# Patient Record
Sex: Male | Born: 1975 | Race: White | Hispanic: No | Marital: Single | Smoking: Former smoker
Health system: Southern US, Community
[De-identification: ages and names within clinical notes are randomized; demographics above are authoritative.]

## PROBLEM LIST (undated history)

## (undated) DIAGNOSIS — C3492 Malignant neoplasm of unspecified part of left bronchus or lung: Secondary | ICD-10-CM

## (undated) HISTORY — DX: Malignant neoplasm of unspecified part of left bronchus or lung: C34.92

## (undated) HISTORY — PX: APPENDECTOMY: SHX54

---

## 2005-03-19 ENCOUNTER — Ambulatory Visit: Payer: Self-pay | Admitting: Cardiology

## 2005-03-19 ENCOUNTER — Emergency Department (HOSPITAL_COMMUNITY): Admission: EM | Admit: 2005-03-19 | Discharge: 2005-03-19 | Payer: Self-pay | Admitting: Emergency Medicine

## 2012-09-15 DIAGNOSIS — R627 Adult failure to thrive: Secondary | ICD-10-CM | POA: Insufficient documentation

## 2012-09-15 DIAGNOSIS — F1090 Alcohol use, unspecified, uncomplicated: Secondary | ICD-10-CM | POA: Insufficient documentation

## 2020-08-13 ENCOUNTER — Telehealth: Payer: Self-pay | Admitting: Pulmonary Disease

## 2020-08-13 NOTE — Telephone Encounter (Signed)
Call patient.  Apparently patient came in with few months of some chest pain, and viral type illness.  Found to have [place].  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  Is described that there may be some necrosis.  As patient is asymptomatic do think that it is reasonable to pursue outpatient work-up.  If patient develops symptoms of pneumonia or worsening acute consider inpatient treatment with antibiotics.  However patient is COVID-positive and ED physician was going to prescribe Paxil.  Think this is reasonable as well.  Will not be able to undergo any bronchoscopic interventions as patient is COVID-positive.  Given this is well think it is reasonable to pursue this on an outpatient basis.  ED physician asked if I could coordinate follow-up.  As I am a minor I do not have ties with actual PCCM.  Was discussed with him.  He is aware of that.  We will also contact health with coordination of follow-up.  They can.  ED physician asked if following up in 1 week be reasonable I agreed with this.  We will also attempt to arrange follow-up from my end as well.  Newell Coral DO Internal Medicine/Pediatrics Pulmonary and Critical Care Fellow PGY-7

## 2020-08-14 ENCOUNTER — Telehealth: Payer: Self-pay | Admitting: Pulmonary Disease

## 2020-08-14 NOTE — Telephone Encounter (Signed)
Please schedule the patient for a consult with Dr. Valeta Harms or Dr. Lamonte Sakai for pulmonary mass / nodule as soon as possible.    He was apparently seen as an outpatient at an outside hospital with a CT of the chest with contrast that showed the following:   Lungs/Pleura: Right lung is well aerated without focal infiltrate or sizable effusion. No parenchymal nodule is seen. Left lung is also well aerated without focal infiltrate or effusion. There is a somewhat spiculated mildly cavitated lesion with associated adjacent nodule seen in the anterior aspect of the left upper lobe. This measures approximately 2.6 x 2.2 cm in greatest AP and transverse dimensions respectively. This extends for approximately 2.6 cm in craniocaudad projection as well. The smaller adjacent nodules measure up to 6 mm.    Please call if questions.    Noe Gens, MSN, APRN, NP-C, AGACNP-BC [facility] 08/14/2020, 7:30 AM   Please see [email or web address] for pager details.   From 7A-7P if no response, please call [phone] After hours, please call ELink [phone]

## 2020-08-14 NOTE — Telephone Encounter (Signed)
Spoke with pt and scheduled for consult on 7/1 at 3:30. Pt was also diagnosed with Covid yesterday so per office policy pt must wait 10 asymptomatic days before coming into office. Pt is aware. Pt's CT was done at [facility] RN will reach out to [place] to secure CT scan prior to pt's consult. Nothing further needed at this time.  Routing to Dr. Lamonte Sakai as Juluis Rainier

## 2020-08-14 NOTE — Telephone Encounter (Signed)
Please put him in one of my reserved nodule spots   Will need to find or have him bring the images for me to review.

## 2020-08-25 ENCOUNTER — Ambulatory Visit (INDEPENDENT_AMBULATORY_CARE_PROVIDER_SITE_OTHER): Payer: Commercial Managed Care - PPO | Admitting: Emergency Medicine

## 2020-08-25 ENCOUNTER — Other Ambulatory Visit: Payer: Self-pay

## 2020-08-25 ENCOUNTER — Encounter: Payer: Self-pay | Admitting: Emergency Medicine

## 2020-08-25 VITALS — BP 120/78 | HR 71 | Temp 98.3°F | Ht 70.0 in | Wt 127.8 lb

## 2020-08-25 DIAGNOSIS — R911 Solitary pulmonary nodule: Secondary | ICD-10-CM | POA: Diagnosis not present

## 2020-08-25 MED ORDER — AMOXICILLIN-POT CLAVULANATE 875-125 MG PO TABS: 1.0000 | ORAL_TABLET | Freq: Two times a day (BID) | ORAL | 0 refills | Status: DC

## 2020-08-25 NOTE — Progress Notes (Signed)
Subjective:    Patient ID: Alex Sims, male    DOB: January 20, 1976, 45 y.o.   MRN: [id number]  HPI 45 year old man with a minimal tobacco history (1 pack year), GERD, who was seen in the ED at [place] with URI symptoms and upper abdominal discomfort, chest pain.  His COVID-19 on 6/19 was positive.   He underwent a CT scan of his chest 08/13/2020 to evaluate the chest discomfort.  I have the report available.  There were no hilar or mediastinal lymph nodes.  There is a somewhat spiculated mild cavitated 2.6 x 2.2 lesion in the left upper lobe.   He was treated with paxlovid, couldn't take it due to side effects. Doesn't sound like he was given any abx for the LUL findings. Still has mid chest pain. Intermittently uses omeprazole, no real effect. No fever, mucous production, no fevers.    Review of Systems As per HPI  Past medical history: GERD Appendectomy  Family history: Negative for CAD, negative for lung cancer  Social History   Socioeconomic History   Marital status: Single    Spouse name: Not on file   Number of children: Not on file   Years of education: Not on file   Highest education level: Not on file  Occupational History   Not on file  Tobacco Use   Smoking status: Some Days    Pack years: 0.00    Types: Cigarettes   Smokeless tobacco: Never   Tobacco comments:    Smokes when drinking, 1-2 on weekends or a day  Vaping Use   Vaping Use: Never used  Substance and Sexual Activity   Alcohol use: Yes    Comment: pint of liquor a day   Drug use: Not on file   Sexual activity: Not on file  Other Topics Concern   Not on file  Social History Narrative   Not on file   Social Determinants of Health   Financial Resource Strain: Not on file  Food Insecurity: Not on file  Transportation Needs: Not on file  Physical Activity: Not on file  Stress: Not on file  Social Connections: Not on file  Intimate Partner Violence: Not on file    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  No military [place] native No TB exposure Remotely worked in Theme park manager.   Not on File   Outpatient Medications Prior to Visit  Medication Sig Dispense Refill   albuterol (VENTOLIN HFA) 108 (90 Base) MCG/ACT inhaler Inhale 2 puffs into the lungs as needed.     omeprazole (PRILOSEC) 20 MG capsule Take 20 mg by mouth daily.     terbinafine (LAMISIL) 250 MG tablet Take 250 mg by mouth daily.     Nirmatrelvir & Ritonavir (PAXLOVID) 20 x 150 MG & 10 x 100MG  TBPK See package instructions.  Date of symptom onset June 16.     PAXLOVID 20 x 150 MG & 10 x 100MG  TBPK Take by mouth.     No facility-administered medications prior to visit.        Objective:   Physical Exam Vitals:   08/25/20 1524  BP: 120/78  Pulse: 71  Temp: 98.3 F (36.8 C)  TempSrc: Oral  SpO2: 96%  Weight: 127 lb 12.8 oz (58 kg)  Height: 5\' 10"  (1.778 m)    Gen: Pleasant, thin, in no distress,  normal affect  ENT: No lesions,  mouth clear,  oropharynx clear, no postnasal  drip  Neck: No JVD, no stridor  Lungs: No use of accessory muscles, no crackles or wheezing on normal respiration, no wheeze on forced expiration  Cardiovascular: RRR, heart sounds normal, no murmur or gallops, no peripheral edema  Musculoskeletal: No deformities, no cyanosis or clubbing  Neuro: alert, awake, non focal  Skin: Warm, no lesions or rash      Assessment & Plan:  Pulmonary nodule 1 cm or greater in diameter Left upper lobe pulmonary nodule in a patient with a minimal tobacco history.  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  He denies any infectious symptoms.  The nodule is concerning for possible malignancy.  He has not been treated empirically.  We will give him Augmentin and then repeat the CT to see if there is an interval change.  Discussed with him that we will need to arrange for bronchoscopy to better evaluate if the  nodule persists.    Please take Augmentin 875 mg twice a day for 7 days.  Call us if you have problems with this medication so that we can order a substitute We will plan to repeat your CT scan of the chest in mid July after you have finished the antibiotics. Follow Dr. Lamonte Sakai next available after the CT scan so that we can review the results together.  Depending on the results we will decide whether we need to arrange for a bronchoscopy to better evaluate your pulmonary nodule.   [name], MD, PhD 08/25/2020, 3:43 PM [facility] [phone] or if no answer before 7:00PM call [phone] For any issues after 7:00PM please call eLink [phone]

## 2020-08-25 NOTE — Assessment & Plan Note (Signed)
Left upper lobe pulmonary nodule in a patient with a minimal tobacco history.  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  He denies any infectious symptoms.  The nodule is concerning for possible malignancy.  He has not been treated empirically.  We will give him Augmentin and then repeat the CT to see if there is an interval change.  Discussed with him that we will need to arrange for bronchoscopy to better evaluate if the nodule persists.    Please take Augmentin 875 mg twice a day for 7 days.  Call us if you have problems with this medication so that we can order a substitute We will plan to repeat your CT scan of the chest in mid July after you have finished the antibiotics. Follow Dr. Lamonte Sakai next available after the CT scan so that we can review the results together.  Depending on the results we will decide whether we need to arrange for a bronchoscopy to better evaluate your pulmonary nodule.

## 2020-08-25 NOTE — Patient Instructions (Signed)
Please take Augmentin 875 mg twice a day for 7 days.  Call us if you have problems with this medication so that we can order a substitute We will plan to repeat your CT scan of the chest in mid July after you have finished the antibiotics. Follow Dr. Lamonte Sakai next available after the CT scan so that we can review the results together.  Depending on the results we will decide whether we need to arrange for a bronchoscopy to better evaluate your pulmonary nodule.

## 2020-09-05 ENCOUNTER — Other Ambulatory Visit: Payer: Self-pay

## 2020-09-05 ENCOUNTER — Ambulatory Visit (INDEPENDENT_AMBULATORY_CARE_PROVIDER_SITE_OTHER)
Admission: RE | Admit: 2020-09-05 | Discharge: 2020-09-05 | Disposition: A | Discharge status: Home / Self Care | Payer: Commercial Managed Care - PPO | Source: Ambulatory Visit | Attending: Emergency Medicine | Admitting: Emergency Medicine

## 2020-09-05 DIAGNOSIS — R911 Solitary pulmonary nodule: Secondary | ICD-10-CM | POA: Diagnosis not present

## 2020-09-08 ENCOUNTER — Telehealth: Payer: Self-pay | Admitting: Emergency Medicine

## 2020-09-08 NOTE — Telephone Encounter (Signed)
Spoke to patient, who is requesting CT results. He would like to know results today if possible.  Dr. Lamonte Sakai, please advise. Thanks

## 2020-09-11 NOTE — Telephone Encounter (Signed)
Called the patient to discuss CT results. LMOVM.  I am planning to arrange for robotic FOB and biopsies. Will also ordeer a PET scan. Will try to call him back to discuss.

## 2020-09-12 NOTE — Telephone Encounter (Signed)
Called the patient again, no answer, left message

## 2020-09-15 ENCOUNTER — Ambulatory Visit (INDEPENDENT_AMBULATORY_CARE_PROVIDER_SITE_OTHER): Payer: Commercial Managed Care - PPO | Admitting: Emergency Medicine

## 2020-09-15 ENCOUNTER — Other Ambulatory Visit: Payer: Self-pay

## 2020-09-15 ENCOUNTER — Telehealth: Payer: Self-pay | Admitting: Emergency Medicine

## 2020-09-15 ENCOUNTER — Encounter: Payer: Self-pay | Admitting: Emergency Medicine

## 2020-09-15 VITALS — BP 112/76 | HR 62 | Temp 97.8°F | Ht 70.0 in | Wt 132.0 lb

## 2020-09-15 DIAGNOSIS — R911 Solitary pulmonary nodule: Secondary | ICD-10-CM

## 2020-09-15 NOTE — Progress Notes (Signed)
Subjective:    Patient ID: Alex Sims, male    DOB: [birth year], 45 y.o.   MRN: [id number]  HPI 45 year old man with a minimal tobacco history (1 pack year), GERD, who was seen in the ED at [place] with URI symptoms and upper abdominal discomfort, chest pain.  His COVID-19 on 6/19 was positive.   He underwent a CT scan of his chest 08/13/2020 to evaluate the chest discomfort.  I have the report available.  There were no hilar or mediastinal lymph nodes.  There is a somewhat spiculated mild cavitated 2.6 x 2.2 lesion in the left upper lobe.   He was treated with paxlovid, couldn't take it due to side effects. Doesn't sound like he was given any abx for the LUL findings. Still has mid chest pain. Intermittently uses omeprazole, no real effect. No fever, mucous production, no fevers.    ROV 09/15/20 --H[place] follows up today for his abnormal CT scan of the chest.  He is 45 with a minimal tobacco history who underwent a CT scan of his chest when he was diagnosed with COVID-19 08/13/2020.  There was a somewhat spiculated mild cavitated left upper lobe lesion.  I treated him with antibiotics in case this was a pneumonia with plans to repeat his CT.  This was done on 09/05/2020 as below.  CT chest 09/05/2020 reviewed by me shows that the peripheral anterior lateral left upper lobe nodule is unchanged, measures 3.1 cm with multiple surrounding satellite nodules up to 6 mm.  There is no mediastinal adenopathy   Review of Systems As per HPI  Past medical history: GERD Appendectomy  Family history: Negative for CAD, negative for lung cancer  Social History   Socioeconomic History   Marital status: Single    Spouse name: Not on file   Number of children: Not on file   Years of education: Not on file   Highest education level: Not on file  Occupational History   Not on file  Tobacco Use   Smoking status: Some Days    Types: Cigarettes   Smokeless tobacco: Never   Tobacco  comments:    Smokes when drinking, 1-2 on weekends or a day  Vaping Use   Vaping Use: Never used  Substance and Sexual Activity   Alcohol use: Yes    Comment: pint of liquor a day   Drug use: Not on file   Sexual activity: Not on file  Other Topics Concern   Not on file  Social History Narrative   Not on file   Social Determinants of Health   Financial Resource Strain: Not on file  Food Insecurity: Not on file  Transportation Needs: Not on file  Physical Activity: Not on file  Stress: Not on file  Social Connections: Not on file  Intimate Partner Violence: Not on file    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  No military [place] native No TB exposure Remotely worked in Theme park manager.   No Known Allergies   Outpatient Medications Prior to Visit  Medication Sig Dispense Refill   albuterol (VENTOLIN HFA) 108 (90 Base) MCG/ACT inhaler Inhale 2 puffs into the lungs as needed.     omeprazole (PRILOSEC) 20 MG capsule Take 20 mg by mouth daily.     terbinafine (LAMISIL) 250 MG tablet Take 250 mg by mouth daily.     amoxicillin-clavulanate (AUGMENTIN) 875-125 MG tablet Take 1 tablet by mouth 2 (two) times daily. (  Patient not taking: Reported on 09/15/2020) 14 tablet 0   No facility-administered medications prior to visit.        Objective:   Physical Exam Vitals:   09/15/20 1502  BP: 112/76  Pulse: 62  Temp: 97.8 F (36.6 C)  TempSrc: Oral  SpO2: 98%  Weight: 132 lb (59.9 kg)  Height: 5\' 10"  (1.778 m)    Gen: Pleasant, thin, in no distress,  normal affect  ENT: No lesions,  mouth clear,  oropharynx clear, no postnasal drip  Neck: No JVD, no stridor  Lungs: No use of accessory muscles, no crackles or wheezing on normal respiration, no wheeze on forced expiration  Cardiovascular: RRR, heart sounds normal, no murmur or gallops, no peripheral edema  Musculoskeletal: No deformities, no cyanosis or clubbing  Neuro: alert, awake,  non focal  Skin: Warm, no lesions or rash      Assessment & Plan:  Pulmonary nodule 1 cm or greater in diameter His left upper lobe mass lesion has not changed after treatment with Augmentin.  Identical to his June CT scan including satellite lesion of 6 mm.  I believe he needs a tissue diagnosis.  Discussed the pros and cons of bronchoscopy with him today and he agrees to proceed.  I will arrange a robot-assisted bronchoscopy hopefully for 10/02/2020.  I will also arrange for pulmonary function testing to assess his suitability for possible resection should this be indicated going forward.  Time spent 30 minutes  [name], MD, PhD 09/15/2020, 3:39 PM [facility] [phone] or if no answer before 7:00PM call [phone] For any issues after 7:00PM please call eLink [phone]

## 2020-09-15 NOTE — Assessment & Plan Note (Signed)
His left upper lobe mass lesion has not changed after treatment with Augmentin.  Identical to his June CT scan including satellite lesion of 6 mm.  I believe he needs a tissue diagnosis.  Discussed the pros and cons of bronchoscopy with him today and he agrees to proceed.  I will arrange a robot-assisted bronchoscopy hopefully for 10/02/2020.  I will also arrange for pulmonary function testing to assess his suitability for possible resection should this be indicated going forward.

## 2020-09-15 NOTE — Telephone Encounter (Signed)
Will review with him at [place] today.

## 2020-09-15 NOTE — Telephone Encounter (Signed)
Pt informed of the following:  Covid test 8/5 between 8am- 12pm  Bronch 8/8 at 10:45 am; 8:45am arrival time  Case # [id number]  Super D disk sent to Endo 7/12 per order

## 2020-09-15 NOTE — Patient Instructions (Signed)
We will arrange for robot assisted bronchoscopy to evaluate your left upper lobe pulmonary nodule.  We will try to get this scheduled for 10/02/2020 as an outpatient.  This will be done under general anesthesia so you will need a designated driver. We will arrange for pulmonary function testing. Follow with Dr Lamonte Sakai in 1 month

## 2020-09-15 NOTE — H&P (View-Only) (Signed)
Subjective:    Patient ID: Alex Sims, male    DOB: May 28, 1975, 45 y.o.   MRN: [id number]  HPI 45 year old man with a minimal tobacco history (1 pack year), GERD, who was seen in the ED at [place] with URI symptoms and upper abdominal discomfort, chest pain.  His COVID-19 on 6/19 was positive.   He underwent a CT scan of his chest 08/13/2020 to evaluate the chest discomfort.  I have the report available.  There were no hilar or mediastinal lymph nodes.  There is a somewhat spiculated mild cavitated 2.6 x 2.2 lesion in the left upper lobe.   He was treated with paxlovid, couldn't take it due to side effects. Doesn't sound like he was given any abx for the LUL findings. Still has mid chest pain. Intermittently uses omeprazole, no real effect. No fever, mucous production, no fevers.    ROV 09/15/20 --Jaion follows up today for his abnormal CT scan of the chest.  He is 45 with a minimal tobacco history who underwent a CT scan of his chest when he was diagnosed with COVID-19 08/13/2020.  There was a somewhat spiculated mild cavitated left upper lobe lesion.  I treated him with antibiotics in case this was a pneumonia with plans to repeat his CT.  This was done on 09/05/2020 as below.  CT chest 09/05/2020 reviewed by me shows that the peripheral anterior lateral left upper lobe nodule is unchanged, measures 3.1 cm with multiple surrounding satellite nodules up to 6 mm.  There is no mediastinal adenopathy   Review of Systems As per HPI  Past medical history: GERD Appendectomy  Family history: Negative for CAD, negative for lung cancer  Social History   Socioeconomic History   Marital status: Single    Spouse name: Not on file   Number of children: Not on file   Years of education: Not on file   Highest education level: Not on file  Occupational History   Not on file  Tobacco Use   Smoking status: Some Days    Types: Cigarettes   Smokeless tobacco: Never   Tobacco  comments:    Smokes when drinking, 1-2 on weekends or a day  Vaping Use   Vaping Use: Never used  Substance and Sexual Activity   Alcohol use: Yes    Comment: pint of liquor a day   Drug use: Not on file   Sexual activity: Not on file  Other Topics Concern   Not on file  Social History Narrative   Not on file   Social Determinants of Health   Financial Resource Strain: Not on file  Food Insecurity: Not on file  Transportation Needs: Not on file  Physical Activity: Not on file  Stress: Not on file  Social Connections: Not on file  Intimate Partner Violence: Not on file    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  No military [place] native No TB exposure Remotely worked in Theme park manager.   No Known Allergies   Outpatient Medications Prior to Visit  Medication Sig Dispense Refill   albuterol (VENTOLIN HFA) 108 (90 Base) MCG/ACT inhaler Inhale 2 puffs into the lungs as needed.     omeprazole (PRILOSEC) 20 MG capsule Take 20 mg by mouth daily.     terbinafine (LAMISIL) 250 MG tablet Take 250 mg by mouth daily.     amoxicillin-clavulanate (AUGMENTIN) 875-125 MG tablet Take 1 tablet by mouth 2 (two) times daily. (  Patient not taking: Reported on 09/15/2020) 14 tablet 0   No facility-administered medications prior to visit.        Objective:   Physical Exam Vitals:   09/15/20 1502  BP: 112/76  Pulse: 62  Temp: 97.8 F (36.6 C)  TempSrc: Oral  SpO2: 98%  Weight: 132 lb (59.9 kg)  Height: 5\' 10"  (1.778 m)    Gen: Pleasant, thin, in no distress,  normal affect  ENT: No lesions,  mouth clear,  oropharynx clear, no postnasal drip  Neck: No JVD, no stridor  Lungs: No use of accessory muscles, no crackles or wheezing on normal respiration, no wheeze on forced expiration  Cardiovascular: RRR, heart sounds normal, no murmur or gallops, no peripheral edema  Musculoskeletal: No deformities, no cyanosis or clubbing  Neuro: alert, awake,  non focal  Skin: Warm, no lesions or rash      Assessment & Plan:  Pulmonary nodule 1 cm or greater in diameter His left upper lobe mass lesion has not changed after treatment with Augmentin.  Identical to his June CT scan including satellite lesion of 6 mm.  I believe he needs a tissue diagnosis.  Discussed the pros and cons of bronchoscopy with him today and he agrees to proceed.  I will arrange a robot-assisted bronchoscopy hopefully for 10/02/2020.  I will also arrange for pulmonary function testing to assess his suitability for possible resection should this be indicated going forward.  Time spent 30 minutes  [name], MD, PhD 09/15/2020, 3:39 PM [facility] [phone] or if no answer before 7:00PM call [phone] For any issues after 7:00PM please call eLink [phone]

## 2020-09-19 NOTE — Telephone Encounter (Signed)
Patient would like to know how long Bronch procedure is. Patient phone number is [phone].

## 2020-09-19 NOTE — Telephone Encounter (Signed)
I called and spoke with patient regarding questions on Bronch procedure. I answered each question and patient verbalized understanding, nothing further needed.

## 2020-09-29 ENCOUNTER — Encounter (HOSPITAL_COMMUNITY): Payer: Self-pay | Admitting: Emergency Medicine

## 2020-09-29 ENCOUNTER — Other Ambulatory Visit: Payer: Self-pay

## 2020-09-29 NOTE — Progress Notes (Signed)
PCP - denies  Cardiologist - denies EKG -  Chest x-ray -  ECHO -  Cardiac Cath -  CPAP -    COVID TEST- postitive 6/19 -   Anesthesia review: n/a  -------------  SDW INSTRUCTIONS:  Your procedure is scheduled on Monday 8/8. Please report to [facility] Main Entrance "A" at 0830 A.M., and check in at the Admitting office. Call this number if you have problems the morning of surgery: [phone]   Remember: Do not eat or drink after midnight the night before your surgery  Medications to take morning of surgery with a sip of water include: Inhaler  --- Please bring all inhalers with you the day of surgery.    As of today, STOP taking any Aspirin (unless otherwise instructed by your surgeon), Aleve, Naproxen, Ibuprofen, Motrin, Advil, Goody's, BC's, all herbal medications, fish oil, and all vitamins.    The Morning of Surgery Do not wear jewelry Do not wear lotions, powders, colognes, or deodorant Men may shave face and neck. Do not bring valuables to the hospital. [facility] is not responsible for any belongings or valuables.  If you are a smoker, DO NOT Smoke 24 hours prior to surgery  If you wear a CPAP at night please bring your mask the morning of surgery   Remember that you must have someone to transport you home after your surgery, and remain with you for 24 hours if you are discharged the same day.  Please bring cases for contacts, glasses, hearing aids, dentures or bridgework because it cannot be worn into surgery.   Patients discharged the day of surgery will not be allowed to drive home.   Please shower the NIGHT BEFORE/MORNING OF SURGERY (use antibacterial soap like DIAL soap if possible). Wear comfortable clothes the morning of surgery. Oral Hygiene is also important to reduce your risk of infection.  Remember - BRUSH YOUR TEETH THE MORNING OF SURGERY WITH YOUR REGULAR TOOTHPASTE  Patient denies shortness of breath, fever, cough and chest pain.

## 2020-10-02 ENCOUNTER — Ambulatory Visit (HOSPITAL_COMMUNITY)
Admission: RE | Admit: 2020-10-02 | Discharge: 2020-10-02 | Disposition: A | Discharge status: Home / Self Care | Payer: Commercial Managed Care - PPO | Attending: Emergency Medicine | Admitting: Emergency Medicine

## 2020-10-02 ENCOUNTER — Encounter (HOSPITAL_COMMUNITY)
Admission: RE | Disposition: A | Discharge status: Home / Self Care | Payer: Self-pay | Source: Home / Self Care | Attending: Emergency Medicine

## 2020-10-02 ENCOUNTER — Telehealth: Payer: Self-pay | Admitting: Emergency Medicine

## 2020-10-02 ENCOUNTER — Ambulatory Visit (HOSPITAL_COMMUNITY): Payer: Commercial Managed Care - PPO | Admitting: Anesthesiology

## 2020-10-02 ENCOUNTER — Other Ambulatory Visit: Payer: Self-pay

## 2020-10-02 ENCOUNTER — Ambulatory Visit (HOSPITAL_COMMUNITY): Payer: Commercial Managed Care - PPO

## 2020-10-02 ENCOUNTER — Encounter (HOSPITAL_COMMUNITY): Payer: Self-pay | Admitting: Emergency Medicine

## 2020-10-02 DIAGNOSIS — Z7951 Long term (current) use of inhaled steroids: Secondary | ICD-10-CM | POA: Diagnosis not present

## 2020-10-02 DIAGNOSIS — Z419 Encounter for procedure for purposes other than remedying health state, unspecified: Secondary | ICD-10-CM

## 2020-10-02 DIAGNOSIS — Z9889 Other specified postprocedural states: Secondary | ICD-10-CM

## 2020-10-02 DIAGNOSIS — Z8616 Personal history of COVID-19: Secondary | ICD-10-CM | POA: Insufficient documentation

## 2020-10-02 DIAGNOSIS — K219 Gastro-esophageal reflux disease without esophagitis: Secondary | ICD-10-CM | POA: Diagnosis not present

## 2020-10-02 DIAGNOSIS — C3412 Malignant neoplasm of upper lobe, left bronchus or lung: Secondary | ICD-10-CM | POA: Insufficient documentation

## 2020-10-02 DIAGNOSIS — Z79899 Other long term (current) drug therapy: Secondary | ICD-10-CM | POA: Insufficient documentation

## 2020-10-02 DIAGNOSIS — R911 Solitary pulmonary nodule: Secondary | ICD-10-CM | POA: Diagnosis present

## 2020-10-02 DIAGNOSIS — F17210 Nicotine dependence, cigarettes, uncomplicated: Secondary | ICD-10-CM | POA: Insufficient documentation

## 2020-10-02 HISTORY — PX: BRONCHIAL BRUSHINGS: SHX5108

## 2020-10-02 HISTORY — PX: BRONCHIAL NEEDLE ASPIRATION BIOPSY: SHX5106

## 2020-10-02 HISTORY — PX: BRONCHIAL WASHINGS: SHX5105

## 2020-10-02 HISTORY — PX: BRONCHIAL BIOPSY: SHX5109

## 2020-10-02 HISTORY — PX: VIDEO BRONCHOSCOPY WITH RADIAL ENDOBRONCHIAL ULTRASOUND: SHX6849

## 2020-10-02 HISTORY — PX: VIDEO BRONCHOSCOPY WITH ENDOBRONCHIAL NAVIGATION: SHX6175

## 2020-10-02 LAB — COMPREHENSIVE METABOLIC PANEL
ALT: 10 U/L (ref 0–44)
AST: 20 U/L (ref 15–41)
Albumin: <1 g/dL — ABNORMAL LOW (ref 3.5–5.0)
Alkaline Phosphatase: 55 U/L (ref 38–126)
Anion gap: 8 (ref 5–15)
BUN: 12 mg/dL (ref 6–20)
CO2: 25 mmol/L (ref 22–32)
Calcium: 8.9 mg/dL (ref 8.9–10.3)
Chloride: 104 mmol/L (ref 98–111)
Creatinine, Ser: <0.3 mg/dL — ABNORMAL LOW (ref 0.61–1.24)
Glucose, Bld: 90 mg/dL (ref 70–99)
Potassium: 3.5 mmol/L (ref 3.5–5.1)
Sodium: 137 mmol/L (ref 135–145)
Total Bilirubin: 0.6 mg/dL (ref 0.3–1.2)
Total Protein: <3 g/dL — ABNORMAL LOW (ref 6.5–8.1)

## 2020-10-02 LAB — CBC
HCT: 45.9 % (ref 39.0–52.0)
Hemoglobin: 15.8 g/dL (ref 13.0–17.0)
MCH: 32.4 pg (ref 26.0–34.0)
MCHC: 34.4 g/dL (ref 30.0–36.0)
MCV: 94.3 fL (ref 80.0–100.0)
Platelets: 268 10*3/uL (ref 150–400)
RBC: 4.87 MIL/uL (ref 4.22–5.81)
RDW: 12.7 % (ref 11.5–15.5)
WBC: 5.8 10*3/uL (ref 4.0–10.5)
nRBC: 0 % (ref 0.0–0.2)

## 2020-10-02 SURGERY — VIDEO BRONCHOSCOPY WITH ENDOBRONCHIAL NAVIGATION
Anesthesia: General

## 2020-10-02 MED ORDER — MIDAZOLAM HCL 5 MG/5ML IJ SOLN
INTRAMUSCULAR | Status: DC | PRN
  Administered 2020-10-02: 2 mg via INTRAVENOUS

## 2020-10-02 MED ORDER — SUCCINYLCHOLINE CHLORIDE 200 MG/10ML IV SOSY
PREFILLED_SYRINGE | INTRAVENOUS | Status: DC | PRN
  Administered 2020-10-02: 100 mg via INTRAVENOUS

## 2020-10-02 MED ORDER — FENTANYL CITRATE (PF) 100 MCG/2ML IJ SOLN: 25.0000 ug | INTRAMUSCULAR | Status: DC | PRN

## 2020-10-02 MED ORDER — CHLORHEXIDINE GLUCONATE 0.12 % MT SOLN
15.0000 mL | Freq: Once | OROMUCOSAL | Status: AC
  Filled 2020-10-02: qty 15

## 2020-10-02 MED ORDER — CHLORHEXIDINE GLUCONATE 0.12 % MT SOLN
OROMUCOSAL | Status: AC
  Administered 2020-10-02: 15 mL via OROMUCOSAL
  Filled 2020-10-02: qty 15

## 2020-10-02 MED ORDER — SUGAMMADEX SODIUM 200 MG/2ML IV SOLN
INTRAVENOUS | Status: DC | PRN
  Administered 2020-10-02: 300 mg via INTRAVENOUS

## 2020-10-02 MED ORDER — DEXAMETHASONE SODIUM PHOSPHATE 4 MG/ML IJ SOLN
INTRAMUSCULAR | Status: DC | PRN
  Administered 2020-10-02: 10 mg via INTRAVENOUS

## 2020-10-02 MED ORDER — FENTANYL CITRATE (PF) 100 MCG/2ML IJ SOLN
INTRAMUSCULAR | Status: DC | PRN
  Administered 2020-10-02 (×2): 50 ug via INTRAVENOUS

## 2020-10-02 MED ORDER — LIDOCAINE 2% (20 MG/ML) 5 ML SYRINGE
INTRAMUSCULAR | Status: DC | PRN
  Administered 2020-10-02: 60 mg via INTRAVENOUS

## 2020-10-02 MED ORDER — LACTATED RINGERS IV SOLN: INTRAVENOUS | Status: DC

## 2020-10-02 MED ORDER — PROPOFOL 10 MG/ML IV BOLUS
INTRAVENOUS | Status: DC | PRN
  Administered 2020-10-02: 150 mg via INTRAVENOUS
  Administered 2020-10-02: 50 mg via INTRAVENOUS

## 2020-10-02 MED ORDER — ONDANSETRON HCL 4 MG/2ML IJ SOLN
INTRAMUSCULAR | Status: DC | PRN
  Administered 2020-10-02: 4 mg via INTRAVENOUS

## 2020-10-02 MED ORDER — ROCURONIUM BROMIDE 10 MG/ML (PF) SYRINGE
PREFILLED_SYRINGE | INTRAVENOUS | Status: DC | PRN
  Administered 2020-10-02: 50 mg via INTRAVENOUS
  Administered 2020-10-02: 20 mg via INTRAVENOUS
  Administered 2020-10-02: 30 mg via INTRAVENOUS

## 2020-10-02 MED ORDER — ONDANSETRON HCL 4 MG/2ML IJ SOLN: 4.0000 mg | Freq: Once | INTRAMUSCULAR | Status: DC | PRN

## 2020-10-02 NOTE — Interval H&P Note (Signed)
History and Physical Interval Note:  10/02/2020 10:01 AM  Alex Sims  has presented today for surgery, with the diagnosis of LEFT UPPER NODE MASS.  The various methods of treatment have been discussed with the patient and family. After consideration of risks, benefits and other options for treatment, the patient has consented to  Procedure(s): ROBOTIC [place] (N/A) as a surgical intervention.  The patient's history has been reviewed, patient examined, no change in status, stable for surgery.  I have reviewed the patient's chart and labs.  Questions were answered to the patient's satisfaction.     Collene Gobble

## 2020-10-02 NOTE — Telephone Encounter (Signed)
He has been positive within the 90 day window. Asymptomatic. Planning to proceed today

## 2020-10-02 NOTE — Transfer of Care (Signed)
Immediate Anesthesia Transfer of Care Note  Patient: Alex Sims  Procedure(s) Performed: ROBOTIC BRONCHOSCOPY WITH ENDOBRONCHIAL NAVIGATION BRONCHIAL BRUSHINGS BRONCHIAL NEEDLE ASPIRATION BIOPSIES BRONCHIAL BIOPSIES  Patient Location: Endoscopy Unit  Anesthesia Type:General  Level of Consciousness: awake  Airway & Oxygen Therapy: Patient Spontanous Breathing and Patient connected to face mask oxygen  Post-op Assessment: Report given to RN and Post -op Vital signs reviewed and stable  Post vital signs: Reviewed and stable  Last Vitals:  Vitals Value Taken Time  BP 120/80 10/02/20 1305  Temp    Pulse 70 10/02/20 1307  Resp 13 10/02/20 1307  SpO2 100 % 10/02/20 1307  Vitals shown include unvalidated device data.  Last Pain:  Vitals:   10/02/20 0928  TempSrc:   PainSc: 0-No pain         Complications: No notable events documented.

## 2020-10-02 NOTE — Op Note (Signed)
Video Bronchoscopy with Robotic Assisted Bronchoscopic Navigation   Date of Operation: 09/18/2020  Pre-op Diagnosis: Left upper lobe nodule  Post-op Diagnosis: Same  Surgeon: [name]  Assistants: Franchot Heidelberg  Anesthesia: General endotracheal anesthesia  Operation: Flexible video fiberoptic bronchoscopy with robotic assistance and biopsies.  Estimated Blood Loss: Minimal  Complications: None  Indications and History: Alex Sims is a 45 y.o. male with history of tobacco use.  He was found to he have a left upper lobe pulmonary nodule when he was evaluated for cough.  Treated for pneumonia but the nodule did not resolve on CT scan 09/05/2020.  Recommendation was made to achieve tissue diagnosis via robotic assisted bronchoscopy with navigation and biopsies. The risks, benefits, complications, treatment options and expected outcomes were discussed with the patient.  The possibilities of pneumothorax, pneumonia, reaction to medication, pulmonary aspiration, perforation of a viscus, bleeding, failure to diagnose a condition and creating a complication requiring transfusion or operation were discussed with the patient who freely signed the consent.    Description of Procedure: The patient was seen in the Preoperative Area, was examined and was deemed appropriate to proceed.  The patient was taken to [facility] endoscopy room 3, identified as Alex Sims and the procedure verified as Flexible Video Fiberoptic Bronchoscopy.  A Time Out was held and the above information confirmed.   Prior to the date of the procedure a high-resolution CT scan of the chest was performed. Utilizing ION software program a virtual tracheobronchial tree was generated to allow the creation of distinct navigation pathways to the patient's parenchymal abnormalities. After being taken to the operating room general anesthesia was initiated and the patient  was orally intubated. The video fiberoptic bronchoscope was  introduced via the endotracheal tube and a general inspection was performed which showed normal right and left lung anatomy, aspiration of the bilateral mainstems was completed to remove any remaining secretions. Robotic catheter inserted into patient's endotracheal tube.   Target #1 left upper lobe nodule: The distinct navigation pathways prepared prior to this procedure were then utilized to navigate to patient's lesion identified on CT scan. The robotic catheter was secured into place and the vision probe was withdrawn.  Lesion location was approximated using fluoroscopy and radial endobronchial ultrasound for peripheral targeting. Under fluoroscopic guidance transbronchial needle brushings, transbronchial needle biopsies, and transbronchial forceps biopsies were performed to be sent for cytology and pathology. A bronchioalveolar lavage was performed in the left upper lobe and sent for cytology.   At the end of the procedure a general airway inspection was performed and there was no evidence of active bleeding. The bronchoscope was removed.  The patient tolerated the procedure well. There was no significant blood loss and there were no obvious complications. A post-procedural chest x-ray is pending.  Samples Target #1: 1. Transbronchial needle brushings from left upper lobe nodule 2. Transbronchial Wang needle biopsies from left upper lobe nodule 3. Transbronchial forceps biopsies from left upper lobe nodule 4. Bronchoalveolar lavage from left upper lobe    Plans:  The patient will be discharged from the PACU to home when recovered from anesthesia and after chest x-ray is reviewed. We will review the cytology, pathology and microbiology results with the patient when they become available. Outpatient followup will be with Dr Lamonte Sakai.    [name], MD, PhD 10/02/2020, 12:58 PM [facility] [phone] or if no answer before 7:00PM call [phone] For any issues after  7:00PM please call eLink [phone]

## 2020-10-02 NOTE — Telephone Encounter (Addendum)
LMTCB x2.   Will forward message to provider as Juluis Rainier so he is aware.   RB please be aware patient did not get his covid test and has called to reschedule. Will have more information once we speak with patient.    10/02/20 4:20pm. Patient had Bronch done.   Nothing further needed at this time.

## 2020-10-02 NOTE — Discharge Instructions (Signed)
Flexible Bronchoscopy, Care After This sheet gives you information about how to care for yourself after your test. Your doctor may also give you more specific instructions. If you have problems or questions, contact your doctor. Follow these instructions at home: Eating and drinking Do not eat or drink anything (not even water) for 2 hours after your test, or until your numbing medicine (local anesthetic) wears off. When your numbness is gone and your cough and gag reflexes have come back, you may: Eat only soft foods. Slowly drink liquids. The day after the test, go back to your normal diet. Driving Do not drive for 24 hours if you were given a medicine to help you relax (sedative). Do not drive or use heavy machinery while taking prescription pain medicine. General instructions  Take over-the-counter and prescription medicines only as told by your doctor. Return to your normal activities as told. Ask what activities are safe for you. Do not use any products that have nicotine or tobacco in them. This includes cigarettes and e-cigarettes. If you need help quitting, ask your doctor. Keep all follow-up visits as told by your doctor. This is important. It is very important if you had a tissue sample (biopsy) taken. Get help right away if: You have shortness of breath that gets worse. You get light-headed. You feel like you are going to pass out (faint). You have chest pain. You cough up: More than a little blood. More blood than before. Summary Do not eat or drink anything (not even water) for 2 hours after your test, or until your numbing medicine wears off. Do not use cigarettes. Do not use e-cigarettes. Get help right away if you have chest pain.  Please call our office for any questions or concerns.  [phone].  This information is not intended to replace advice given to you by your health care provider. Make sure you discuss any questions you have with your health care  provider. Document Released: 12/09/2008 Document Revised: 01/24/2017 Document Reviewed: 03/01/2016 Elsevier Patient Education  2020 [employer].

## 2020-10-02 NOTE — Anesthesia Procedure Notes (Signed)
Procedure Name: Intubation Date/Time: 10/02/2020 10:30 AM Performed by: Lieutenant Diego, CRNA Pre-anesthesia Checklist: Patient identified, Emergency Drugs available, Suction available and Patient being monitored Patient Re-evaluated:Patient Re-evaluated prior to induction Oxygen Delivery Method: Circle system utilized Preoxygenation: Pre-oxygenation with 100% oxygen Induction Type: IV induction Ventilation: Mask ventilation without difficulty Laryngoscope Size: Miller and 2 Grade View: Grade I Tube type: Oral Tube size: 8.5 mm Number of attempts: 1 Airway Equipment and Method: Stylet and Oral airway Placement Confirmation: ETT inserted through vocal cords under direct vision, positive ETCO2 and breath sounds checked- equal and bilateral Secured at: 20 cm Tube secured with: Tape Dental Injury: Teeth and Oropharynx as per pre-operative assessment

## 2020-10-02 NOTE — Anesthesia Preprocedure Evaluation (Addendum)
Anesthesia Evaluation  Patient identified by MRN, date of birth, ID band Patient awake    Reviewed: Allergy & Precautions, NPO status , Patient's Chart, lab work & pertinent test results  Airway Mallampati: III  TM Distance: >3 FB Neck ROM: Full    Dental no notable dental hx. (+) Teeth Intact, Dental Advisory Given   Pulmonary Current Smoker and Patient abstained from smoking.,  LUL mass: Persistent peripheral nodular density within the anterolateral left upper lobe. This measures 3.1 cm and is not significantly changed in the interval, image 35/3. Multiple surrounding satellite nodules are identified within the left upper lobe measuring up to 6 mm.  hasnt smoked in 2 weeks- per pt, 1 cigg/wk prior to that  Does not use albuterol    Pulmonary exam normal breath sounds clear to auscultation       Cardiovascular negative cardio ROS Normal cardiovascular exam Rhythm:Regular Rate:Normal     Neuro/Psych negative neurological ROS  negative psych ROS   GI/Hepatic negative GI ROS, Neg liver ROS,   Endo/Other  negative endocrine ROS  Renal/GU negative Renal ROS  negative genitourinary   Musculoskeletal negative musculoskeletal ROS (+)   Abdominal   Peds  Hematology negative hematology ROS (+)   Anesthesia Other Findings   Reproductive/Obstetrics negative OB ROS                            Anesthesia Physical Anesthesia Plan  ASA: 2  Anesthesia Plan: General   Post-op Pain Management:    Induction: Intravenous  PONV Risk Score and Plan: 1 and Ondansetron, Midazolam and Treatment may vary due to age or medical condition  Airway Management Planned: Oral ETT  Additional Equipment: None  Intra-op Plan:   Post-operative Plan: Extubation in OR  Informed Consent: I have reviewed the patients History and Physical, chart, labs and discussed the procedure including the risks, benefits and  alternatives for the proposed anesthesia with the patient or authorized representative who has indicated his/her understanding and acceptance.     Dental advisory given  Plan Discussed with: CRNA  Anesthesia Plan Comments: (Mall3- last intubation 2017 for lap appy: Mask ventilation: easy; Size: 7.5; Secured at: 22 cm; ETT Type: Oral; Cuffed: Cuffed; Insertion attempts: 1; View: I; Blade: MAC 3; )       Anesthesia Quick Evaluation

## 2020-10-03 NOTE — Anesthesia Postprocedure Evaluation (Signed)
Anesthesia Post Note  Patient: Zimri Brennen  Procedure(s) Performed: ROBOTIC BRONCHOSCOPY WITH ENDOBRONCHIAL NAVIGATION BRONCHIAL BRUSHINGS BRONCHIAL NEEDLE ASPIRATION BIOPSIES BRONCHIAL BIOPSIES BRONCHIAL WASHINGS RADIAL ENDOBRONCHIAL ULTRASOUND     Patient location during evaluation: PACU Anesthesia Type: General Level of consciousness: awake and alert Pain management: pain level controlled Vital Signs Assessment: post-procedure vital signs reviewed and stable Respiratory status: spontaneous breathing, nonlabored ventilation and respiratory function stable Cardiovascular status: blood pressure returned to baseline and stable Postop Assessment: no apparent nausea or vomiting Anesthetic complications: no   No notable events documented.  Last Vitals:  Vitals:   10/02/20 1325 10/02/20 1345  BP: 118/83 121/89  Pulse:    Resp:    Temp:    SpO2:      Last Pain:  Vitals:   10/02/20 1345  TempSrc:   PainSc: 0-No pain                 Merlinda Frederick

## 2020-10-04 ENCOUNTER — Encounter (HOSPITAL_COMMUNITY): Payer: Self-pay | Admitting: Emergency Medicine

## 2020-10-04 ENCOUNTER — Telehealth: Payer: Self-pay | Admitting: Emergency Medicine

## 2020-10-04 DIAGNOSIS — C3492 Malignant neoplasm of unspecified part of left bronchus or lung: Secondary | ICD-10-CM

## 2020-10-04 LAB — CYTOLOGY - NON PAP

## 2020-10-04 NOTE — Telephone Encounter (Signed)
Reviewed pathology results with the patient by phone.  I think he could be a surgical candidate and will perform a PET scan, pulmonary function testing and then refer [facility] to [facility].  He agrees with the plan.

## 2020-10-04 NOTE — Telephone Encounter (Signed)
Called the pt to discuss FOB results  >> shows adenoCA. No answer, left message. Will need to call back. He will need referral to [facility], needs PFT scheduled, PET scan. I think he should be a surgical candidate.

## 2020-10-04 NOTE — Telephone Encounter (Signed)
Pt is returning phone call from Dr. Lamonte Sakai to go over his results. Pls regard; [phone].

## 2020-10-05 ENCOUNTER — Encounter: Payer: Self-pay | Admitting: *Deleted

## 2020-10-05 DIAGNOSIS — R911 Solitary pulmonary nodule: Secondary | ICD-10-CM

## 2020-10-05 NOTE — Progress Notes (Signed)
I received referral on Alex Sims today. I updated new patient coordinator to call and schedule him to be seen on 10/10/20 labs and to see Dr. Julien Nordmann.

## 2020-10-06 ENCOUNTER — Telehealth: Payer: Self-pay | Admitting: Internal Medicine

## 2020-10-06 NOTE — Telephone Encounter (Signed)
Received a new pt referral from Dr. Lamonte Sakai for dx of lung cancer. Alex Sims has been scheduled to see Dr. Julien Nordmann on 8/17 at 2:15pm w/labs at 1:45pm. I lft the appt date and time on the pt's vm. I updated the navigator.

## 2020-10-11 ENCOUNTER — Encounter: Payer: Self-pay | Admitting: *Deleted

## 2020-10-11 ENCOUNTER — Other Ambulatory Visit: Payer: Self-pay

## 2020-10-11 ENCOUNTER — Inpatient Hospital Stay: Payer: Commercial Managed Care - PPO | Attending: Internal Medicine | Admitting: Internal Medicine

## 2020-10-11 ENCOUNTER — Inpatient Hospital Stay: Payer: Commercial Managed Care - PPO

## 2020-10-11 ENCOUNTER — Encounter: Payer: Self-pay | Admitting: Internal Medicine

## 2020-10-11 DIAGNOSIS — Z9049 Acquired absence of other specified parts of digestive tract: Secondary | ICD-10-CM | POA: Diagnosis not present

## 2020-10-11 DIAGNOSIS — C3492 Malignant neoplasm of unspecified part of left bronchus or lung: Secondary | ICD-10-CM

## 2020-10-11 DIAGNOSIS — R911 Solitary pulmonary nodule: Secondary | ICD-10-CM

## 2020-10-11 DIAGNOSIS — Z79899 Other long term (current) drug therapy: Secondary | ICD-10-CM | POA: Insufficient documentation

## 2020-10-11 DIAGNOSIS — Z77098 Contact with and (suspected) exposure to other hazardous, chiefly nonmedicinal, chemicals: Secondary | ICD-10-CM | POA: Diagnosis not present

## 2020-10-11 DIAGNOSIS — F1911 Other psychoactive substance abuse, in remission: Secondary | ICD-10-CM | POA: Diagnosis not present

## 2020-10-11 DIAGNOSIS — R5383 Other fatigue: Secondary | ICD-10-CM | POA: Diagnosis not present

## 2020-10-11 DIAGNOSIS — J984 Other disorders of lung: Secondary | ICD-10-CM | POA: Insufficient documentation

## 2020-10-11 DIAGNOSIS — Z800 Family history of malignant neoplasm of digestive organs: Secondary | ICD-10-CM | POA: Insufficient documentation

## 2020-10-11 DIAGNOSIS — R0602 Shortness of breath: Secondary | ICD-10-CM | POA: Diagnosis not present

## 2020-10-11 DIAGNOSIS — F17210 Nicotine dependence, cigarettes, uncomplicated: Secondary | ICD-10-CM | POA: Insufficient documentation

## 2020-10-11 DIAGNOSIS — C3412 Malignant neoplasm of upper lobe, left bronchus or lung: Secondary | ICD-10-CM | POA: Insufficient documentation

## 2020-10-11 LAB — CBC WITH DIFFERENTIAL (CANCER CENTER ONLY)
Abs Immature Granulocytes: 0.01 10*3/uL (ref 0.00–0.07)
Basophils Absolute: 0.1 10*3/uL (ref 0.0–0.1)
Basophils Relative: 1 %
Eosinophils Absolute: 0 10*3/uL (ref 0.0–0.5)
Eosinophils Relative: 1 %
HCT: 43.8 % (ref 39.0–52.0)
Hemoglobin: 15.2 g/dL (ref 13.0–17.0)
Immature Granulocytes: 0 %
Lymphocytes Relative: 31 %
Lymphs Abs: 1.3 10*3/uL (ref 0.7–4.0)
MCH: 32.1 pg (ref 26.0–34.0)
MCHC: 34.7 g/dL (ref 30.0–36.0)
MCV: 92.4 fL (ref 80.0–100.0)
Monocytes Absolute: 0.5 10*3/uL (ref 0.1–1.0)
Monocytes Relative: 10 %
Neutro Abs: 2.5 10*3/uL (ref 1.7–7.7)
Neutrophils Relative %: 57 %
Platelet Count: 272 10*3/uL (ref 150–400)
RBC: 4.74 MIL/uL (ref 4.22–5.81)
RDW: 12.7 % (ref 11.5–15.5)
WBC Count: 4.4 10*3/uL (ref 4.0–10.5)
nRBC: 0 % (ref 0.0–0.2)

## 2020-10-11 LAB — CMP (CANCER CENTER ONLY)
ALT: 12 U/L (ref 0–44)
AST: 25 U/L (ref 15–41)
Albumin: 4.4 g/dL (ref 3.5–5.0)
Alkaline Phosphatase: 77 U/L (ref 38–126)
Anion gap: 11 (ref 5–15)
BUN: 17 mg/dL (ref 6–20)
CO2: 25 mmol/L (ref 22–32)
Calcium: 9.3 mg/dL (ref 8.9–10.3)
Chloride: 104 mmol/L (ref 98–111)
Creatinine: 0.82 mg/dL (ref 0.61–1.24)
GFR, Estimated: >60 mL/min (ref >60)
Glucose, Bld: 79 mg/dL (ref 70–99)
Potassium: 4.1 mmol/L (ref 3.5–5.1)
Sodium: 140 mmol/L (ref 135–145)
Total Bilirubin: 0.8 mg/dL (ref 0.3–1.2)
Total Protein: 7.7 g/dL (ref 6.5–8.1)

## 2020-10-11 NOTE — Progress Notes (Signed)
[place] Telephone:([phone]   Fax:([phone]) [phone]  CONSULT NOTE  REFERRING PHYSICIAN: Dr. [name]  REASON FOR CONSULTATION:  45 years old white male recently diagnosed with lung cancer.  HPI Alex Sims is a 45 y.o. male with no significant past medical history except for questionable thyroid disorder.  The patient mentions that on Father's Day he started having soreness in his chest and presented to the emergency department at [facility] in [place] for evaluation.  He has a chest x-ray performed on 08/13/2020 that showed left upper lobe 3.1 cm suspicious mass.  This was followed by CT scan of the chest on the same day and that showed left upper lobe 2.6 x 2.2 x 2.6 cm mass concerning for malignancy with additional 0.6 nodule adjacent to it.  The patient was referred to Dr. Lamonte Sakai for evaluation and management of his condition.  He had CT super D of the chest on September 05, 2020 and that showed persistent peripheral nodular density within the anterior lateral left upper lobe measuring 3.1 cm.  There were multiple surrounding satellite nodules identified within the left upper lobe measuring up to 0.6 cm.  There was no enlarged lymph nodes.  The findings are suspicious for primary bronchogenic carcinoma.  On October 02, 2020 the patient underwent bronchoscopy with electromagnetic navigation bronchoscopy under the care of Dr. Lamonte Sakai and the final pathology [id number]) showed malignant cells consistent with adenocarcinoma. Dr. Lamonte Sakai kindly referred the patient to me today for evaluation and recommendation regarding treatment of his condition. When seen today he continues to have the sternal chest soreness with mild shortness of breath with exertion but no cough or hemoptysis.  He denied having any recent nausea, vomiting, diarrhea or constipation.  He has no headache or visual changes.  He has no weight loss or night sweats. Family history significant for father who had  suspicious colon cancer and mother was healthy. The patient is single and has a 37 years old daughter.  He works in a [employer].  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  He smoked occasionally few cigarettes every few days.  He also drinks a pint of alcohol every day.  He has a history of drug abuse in his younger age.  HPI  No past medical history on file.  Past Surgical History:  Procedure Laterality Date   APPENDECTOMY     BRONCHIAL BIOPSY  10/02/2020   Procedure: BRONCHIAL BIOPSIES;  Surgeon: Collene Gobble, MD;  Location[facility] ENDOSCOPY;  Service: Pulmonary;;   BRONCHIAL BRUSHINGS  10/02/2020   Procedure: BRONCHIAL BRUSHINGS;  Surgeon: Collene Gobble, MD;  Location: [facility] ENDOSCOPY;  Service: Pulmonary;;   BRONCHIAL NEEDLE ASPIRATION BIOPSY  10/02/2020   Procedure: BRONCHIAL NEEDLE ASPIRATION BIOPSIES;  Surgeon: Collene Gobble, MD;  Location: [place];  Service: Pulmonary;;   BRONCHIAL WASHINGS  10/02/2020   Procedure: BRONCHIAL WASHINGS;  Surgeon: Collene Gobble, MD;  Location: [facility] ENDOSCOPY;  Service: Pulmonary;;   VIDEO BRONCHOSCOPY WITH ENDOBRONCHIAL NAVIGATION N/A 10/02/2020   Procedure: ROBOTIC BRONCHOSCOPY WITH ENDOBRONCHIAL NAVIGATION;  Surgeon: Collene Gobble, MD;  Location: [facility] ENDOSCOPY;  Service: Pulmonary;  Laterality: N/A;   VIDEO BRONCHOSCOPY WITH RADIAL ENDOBRONCHIAL ULTRASOUND  10/02/2020   Procedure: RADIAL ENDOBRONCHIAL ULTRASOUND;  Surgeon: Collene Gobble, MD;  Location: [facility] ENDOSCOPY;  Service: Pulmonary;;    No family history on file.  Social History Social History   Tobacco Use   Smoking status: Some Days  Types: Cigarettes   Smokeless tobacco: Never   Tobacco comments:    Smokes when drinking, 1-2 on weekends or a day  Vaping Use   Vaping Use: Never used  Substance Use Topics   Alcohol use: Yes    Comment: "pint of liquor a day" per pt - 09/29/20   Drug use: Never    No Known Allergies  Current Outpatient Medications  Medication Sig  Dispense Refill   albuterol (VENTOLIN HFA) 108 (90 Base) MCG/ACT inhaler Inhale 2 puffs into the lungs every 6 (six) hours as needed for wheezing or shortness of breath.     Pediatric Multiple Vitamins (MULTIVITAMIN CHILDRENS) CHEW Chew 2 each by mouth daily.     No current facility-administered medications for this visit.    Review of Systems  Constitutional: positive for fatigue Eyes: negative Ears, nose, mouth, throat, and face: negative Respiratory: positive for pleurisy/chest pain Cardiovascular: negative Gastrointestinal: negative Genitourinary:negative Integument/breast: negative Hematologic/lymphatic: negative Musculoskeletal:negative Neurological: negative Behavioral/Psych: negative Endocrine: negative Allergic/Immunologic: negative  Physical Exam  PPJ:KDTOI, healthy, no distress, well nourished, well developed, and anxious SKIN: skin color, texture, turgor are normal, no rashes or significant lesions HEAD: Normocephalic, No masses, lesions, tenderness or abnormalities EYES: normal, PERRLA, Conjunctiva are pink and non-injected EARS: External ears normal, Canals clear OROPHARYNX:no exudate, no erythema, and lips, buccal mucosa, and tongue normal  NECK: supple, no adenopathy, no JVD LYMPH:  no palpable lymphadenopathy, no hepatosplenomegaly LUNGS: clear to auscultation , and palpation HEART: regular rate & rhythm, no murmurs, and no gallops ABDOMEN:abdomen soft, non-tender, normal bowel sounds, and no masses or organomegaly BACK: No CVA tenderness, Range of motion is normal EXTREMITIES:no joint deformities, effusion, or inflammation, no edema  NEURO: alert & oriented x 3 with fluent speech, no focal motor/sensory deficits  PERFORMANCE STATUS: ECOG 0  LABORATORY DATA: Lab Results  Component Value Date   WBC 4.4 10/11/2020   HGB 15.2 10/11/2020   HCT 43.8 10/11/2020   MCV 92.4 10/11/2020   PLT 272 10/11/2020      Chemistry      Component Value Date/Time    NA 137 10/02/2020 0908   K 3.5 10/02/2020 0908   CL 104 10/02/2020 0908   CO2 25 10/02/2020 0908   BUN 12 10/02/2020 0908   CREATININE <0.30 (L) 10/02/2020 0908      Component Value Date/Time   CALCIUM 8.9 10/02/2020 0908   ALKPHOS 55 10/02/2020 0908   AST 20 10/02/2020 0908   ALT 10 10/02/2020 0908   BILITOT 0.6 10/02/2020 0908       RADIOGRAPHIC STUDIES: DG Chest Port 1 View  Result Date: 10/02/2020 CLINICAL DATA:  Status post bronchoscopy. EXAM: PORTABLE CHEST 1 VIEW COMPARISON:  09/05/2020 FINDINGS: Heart size appears within normal limits. No pleural effusion. Pulmonary vascular congestion noted. Left upper lobe lung nodule is unchanged measuring 2.4 cm. No pneumothorax identified after bronchoscopy. IMPRESSION: 1. No pneumothorax identified after bronchoscopy. 2. Stable left upper lobe lung nodule. Electronically Signed   By: Kerby Moors M.D.   On: 10/02/2020 14:29   DG C-ARM BRONCHOSCOPY  Result Date: 10/02/2020 C-ARM BRONCHOSCOPY: Fluoroscopy was utilized by the requesting physician.  No radiographic interpretation.    ASSESSMENT: This is a very pleasant 45 years old white male recently diagnosed with stage IB/IIB (T2a/T3, N0, M0) non-small cell lung cancer, adenocarcinoma presented with left upper lobe lung mass and suspicious smaller nodule in the left upper lobe diagnosed in August 2022.  Pending staging work-up.   PLAN: I had  a lengthy discussion with the patient today about his current disease stage, prognosis and treatment options. I recommended for the patient to proceed with the PET scan as scheduled next week. I will also arrange for the patient to have MRI of the brain to rule out metastatic disease to the brain. I explained to the patient that if the PET scan showed no other evidence of metastatic disease, his best treatment option will be consideration of surgical resection for curative intent. We will refer the patient to cardiothoracic surgery for  evaluation. Will make sure the patient has pulmonary function test as previously planned by Dr. Lamonte Sakai before seeing cardiothoracic surgery. I will see the patient back for follow-up visit after his surgical resection for discussion of any adjuvant therapy if needed. If the patient is not a surgical candidate for any reason, we will consider referring him to radiation oncology for SBRT or concurrent chemoradiation if he has locally advanced disease. The patient is in agreement with the current plan. He was advised to call immediately if he has any other concerning symptoms in the interval. The patient voices understanding of current disease status and treatment options and is in agreement with the current care plan.  All questions were answered. The patient knows to call the clinic with any problems, questions or concerns. We can certainly see the patient much sooner if necessary.  Thank you so much for allowing me to participate in the care of Alex Sims. I will continue to follow up the patient with you and assist in his care.  The total time spent in the appointment was 60 minutes.  Disclaimer: This note was dictated with voice recognition software. Similar sounding words can inadvertently be transcribed and may not be corrected upon review.   Eilleen Kempf October 11, 2020, 2:15 PM

## 2020-10-11 NOTE — Progress Notes (Signed)
I met Alex Sims today at his first visit to see Dr. Julien Nordmann. Patient is newly dx with lung cancer and plan of care is referral to thoracic surgery, MRI brain, and PFT's.  I help to explain plan of care to patient and he verbalized understanding.  Referrals completed and I reached out to Dr. Lamonte Sakai about PFT's that he already ordered. Will follow up on appts.

## 2020-10-12 ENCOUNTER — Other Ambulatory Visit: Payer: Self-pay | Admitting: *Deleted

## 2020-10-12 NOTE — Progress Notes (Signed)
The proposed treatment discussed in cancer conference is for discussion purpose only and is not a binding recommendation. The patient was not physically examined nor present for their treatment options. Therefore, final treatment plans cannot be decided.  ?

## 2020-10-13 ENCOUNTER — Telehealth: Payer: Self-pay | Admitting: *Deleted

## 2020-10-13 LAB — CYTOLOGY - NON PAP

## 2020-10-13 NOTE — Telephone Encounter (Signed)
I called emergency contact number to contact Mr. Alex Sims.

## 2020-10-13 NOTE — Telephone Encounter (Signed)
Alex Sims called me back on his home phone number. I updated him on his appts.  He verbalized understanding of appts time and place.

## 2020-10-13 NOTE — Telephone Encounter (Signed)
I followed up on Alex Sims's schedule. I called him to see if he had any questions regarding his schedule. I was unable to reach but did leave a vm message with my name and phone number to call.

## 2020-10-14 NOTE — Progress Notes (Signed)
Covid + results called in for [facility]. Procedure scheduled for 10/19/20. Dr. Benay Spice on call for Dr. Julien Nordmann notified. [facility] radiology notified.

## 2020-10-16 ENCOUNTER — Telehealth: Payer: Self-pay | Admitting: Emergency Medicine

## 2020-10-16 NOTE — Telephone Encounter (Signed)
Spoke to patient, who stated that he had covid test in preparation of PFT tomorrow.  He received notification today that he is positive for covid. Denied any sx.  His daughter tested positive on Friday.  He tested positive in June and he is concerned that test is still showing positive from recent infection.   Dr. Lamonte Sakai, please advise. Patient has pending PFT 10/17/2020 and OV with you on 10/18/2020. Will await response form RB before canceling appt./

## 2020-10-16 NOTE — Telephone Encounter (Signed)
I called and spoke with patient regarding message and RB response. I checked with lauren and policy for pft while testing positive is 90 days from the first positive test. Patient tested positive in June so will need to be Sept. I have cancelled appts for June and made appt for pft and follow up in sept. Patient verbalized understanding, nothing further needed.

## 2020-10-16 NOTE — Telephone Encounter (Signed)
He is correct that there is a good chance that his test remains positive after his initial diagnosis in June.  Unfortunately no way for [place] to be sure.  I think the rule we have established for PFT is that the COVID needs to be negative?  He can cancel his office visit with me but it is important for him to get the PFT as soon as possible.  Please check with our admin Lorenz Coaster, Tammy D) to see if our policy would allow him to get his PFT since this may be a residual positive from June.

## 2020-10-18 ENCOUNTER — Ambulatory Visit: Payer: Commercial Managed Care - PPO | Admitting: Emergency Medicine

## 2020-10-19 ENCOUNTER — Encounter (HOSPITAL_COMMUNITY): Payer: Commercial Managed Care - PPO

## 2020-10-19 ENCOUNTER — Ambulatory Visit (HOSPITAL_COMMUNITY)
Admission: RE | Admit: 2020-10-19 | Discharge: 2020-10-19 | Disposition: A | Discharge status: Home / Self Care | Payer: Commercial Managed Care - PPO | Source: Ambulatory Visit | Attending: Internal Medicine | Admitting: Internal Medicine

## 2020-10-26 ENCOUNTER — Encounter: Payer: Commercial Managed Care - PPO | Admitting: Thoracic Surgery (Cardiothoracic Vascular Surgery)

## 2020-10-31 ENCOUNTER — Other Ambulatory Visit: Payer: Self-pay

## 2020-10-31 ENCOUNTER — Encounter (HOSPITAL_COMMUNITY)
Admission: RE | Admit: 2020-10-31 | Discharge: 2020-10-31 | Disposition: A | Discharge status: Home / Self Care | Payer: Commercial Managed Care - PPO | Source: Ambulatory Visit | Attending: Emergency Medicine | Admitting: Emergency Medicine

## 2020-10-31 ENCOUNTER — Ambulatory Visit (HOSPITAL_COMMUNITY)
Admission: RE | Admit: 2020-10-31 | Discharge: 2020-10-31 | Disposition: A | Discharge status: Home / Self Care | Payer: Commercial Managed Care - PPO | Source: Ambulatory Visit | Attending: Internal Medicine | Admitting: Internal Medicine

## 2020-10-31 DIAGNOSIS — C3492 Malignant neoplasm of unspecified part of left bronchus or lung: Secondary | ICD-10-CM | POA: Diagnosis present

## 2020-10-31 DIAGNOSIS — R911 Solitary pulmonary nodule: Secondary | ICD-10-CM | POA: Insufficient documentation

## 2020-10-31 LAB — GLUCOSE, CAPILLARY: Glucose-Capillary: 100 mg/dL — ABNORMAL HIGH (ref 70–99)

## 2020-10-31 MED ORDER — FLUDEOXYGLUCOSE F - 18 (FDG) INJECTION
8.0000 | Freq: Once | INTRAVENOUS | Status: AC | PRN
  Administered 2020-10-31: 6.3 via INTRAVENOUS

## 2020-10-31 MED ORDER — GADOBUTROL 1 MMOL/ML IV SOLN
6.0000 mL | Freq: Once | INTRAVENOUS | Status: AC | PRN
  Administered 2020-10-31: 6 mL via INTRAVENOUS

## 2020-11-02 ENCOUNTER — Telehealth: Payer: Self-pay

## 2020-11-02 NOTE — Telephone Encounter (Signed)
Pt LM requesting a call. He did not indicated the reason for his call.  I have called the pt back and LM.

## 2020-11-13 ENCOUNTER — Encounter: Payer: Self-pay | Admitting: *Deleted

## 2020-11-14 ENCOUNTER — Telehealth: Payer: Self-pay | Admitting: Emergency Medicine

## 2020-11-14 ENCOUNTER — Other Ambulatory Visit: Payer: Self-pay

## 2020-11-14 ENCOUNTER — Ambulatory Visit (INDEPENDENT_AMBULATORY_CARE_PROVIDER_SITE_OTHER): Payer: Commercial Managed Care - PPO | Admitting: Emergency Medicine

## 2020-11-14 DIAGNOSIS — R911 Solitary pulmonary nodule: Secondary | ICD-10-CM

## 2020-11-14 LAB — PULMONARY FUNCTION TEST
DL/VA % pred: 106 %
DL/VA: 4.81 ml/min/mmHg/L
DLCO cor % pred: 98 %
DLCO cor: 30.71 ml/min/mmHg
DLCO unc % pred: 100 %
DLCO unc: 31.22 ml/min/mmHg
FEF 25-75 Post: 3.61 L/sec
FEF 25-75 Pre: 3.21 L/sec
FEF2575-%Change-Post: 12 %
FEF2575-%Pred-Post: 94 %
FEF2575-%Pred-Pre: 84 %
FEV1-%Change-Post: 4 %
FEV1-%Pred-Post: 92 %
FEV1-%Pred-Pre: 88 %
FEV1-Post: 3.9 L
FEV1-Pre: 3.73 L
FEV1FVC-%Change-Post: 4 %
FEV1FVC-%Pred-Pre: 98 %
FEV6-%Change-Post: 1 %
FEV6-%Pred-Post: 93 %
FEV6-%Pred-Pre: 92 %
FEV6-Post: 4.85 L
FEV6-Pre: 4.78 L
FEV6FVC-%Pred-Post: 103 %
FEV6FVC-%Pred-Pre: 103 %
FVC-%Change-Post: 0 %
FVC-%Pred-Post: 90 %
FVC-%Pred-Pre: 89 %
FVC-Post: 4.85 L
FVC-Pre: 4.82 L
Post FEV1/FVC ratio: 81 %
Post FEV6/FVC ratio: 100 %
Pre FEV1/FVC ratio: 77 %
Pre FEV6/FVC Ratio: 100 %
RV % pred: 110 %
RV: 2.2 L
TLC % pred: 95 %
TLC: 6.84 L

## 2020-11-14 NOTE — Progress Notes (Signed)
PFT done today. 

## 2020-11-14 NOTE — Telephone Encounter (Signed)
Patient brought in FMLA forms today for completion by Dr. Lamonte Sakai.  Emailed the forms to [employer].

## 2020-11-21 ENCOUNTER — Encounter: Payer: Commercial Managed Care - PPO | Admitting: Thoracic Surgery (Cardiothoracic Vascular Surgery)

## 2020-11-22 ENCOUNTER — Other Ambulatory Visit: Payer: Self-pay

## 2020-11-22 ENCOUNTER — Encounter: Payer: Self-pay | Admitting: Emergency Medicine

## 2020-11-22 ENCOUNTER — Ambulatory Visit (INDEPENDENT_AMBULATORY_CARE_PROVIDER_SITE_OTHER): Payer: Commercial Managed Care - PPO | Admitting: Emergency Medicine

## 2020-11-22 DIAGNOSIS — C3492 Malignant neoplasm of unspecified part of left bronchus or lung: Secondary | ICD-10-CM | POA: Diagnosis not present

## 2020-11-22 NOTE — Progress Notes (Signed)
Subjective:    Patient ID: Alex Sims, male    DOB: [birth year], 45 y.o.   MRN: [id number]  HPI 45 year old man with a minimal tobacco history (1 pack year), GERD, who was seen in the ED at [facility] with URI symptoms and upper abdominal discomfort, chest pain.  His COVID-19 on 6/19 was positive.   He underwent a CT scan of his chest 08/13/2020 to evaluate the chest discomfort.  I have the report available.  There were no hilar or mediastinal lymph nodes.  There is a somewhat spiculated mild cavitated 2.6 x 2.2 lesion in the left upper lobe.   He was treated with paxlovid, couldn't take it due to side effects. Doesn't sound like he was given any abx for the LUL findings. Still has mid chest pain. Intermittently uses omeprazole, no real effect. No fever, mucous production, no fevers.    ROV 09/15/20 --[name] follows up today for his abnormal CT scan of the chest.  He is 45 with a minimal tobacco history who underwent a CT scan of his chest when he was diagnosed with COVID-19 08/13/2020.  There was a somewhat spiculated mild cavitated left upper lobe lesion.  I treated him with antibiotics in case this was a pneumonia with plans to repeat his CT.  This was done on 09/05/2020 as below.  CT chest 09/05/2020 reviewed by me shows that the peripheral anterior lateral left upper lobe nodule is unchanged, measures 3.1 cm with multiple surrounding satellite nodules up to 6 mm.  There is no mediastinal adenopathy  ROV 11/22/20 --45 year old gentleman who follows up today for his abnormal CT scan of the chest.  He had a spiculated mild cavitated left upper lobe lesion that did not clear with antibiotics and underwent navigational bronchoscopy.  Diagnosed with adenocarcinoma   PFT performed on 11/14/2020 reviewed by me showed grossly normal airflows without a bronchodilator response, normal lung volumes and a normal diffusion capacity.  MRI brain 10/31/2020 reviewed by me shows no evidence of intracranial  metastatic disease. PET scan 10/31/2020 reviewed by me showed hypermetabolic peripheral nodule in the left upper lobe with some surrounding satellite nodules compatible with primary lung cancer and no evidence of metastatic disease in the chest abdomen or pelvis.   Review of Systems As per HPI  Past medical history: GERD Appendectomy  Family history: Negative for CAD, negative for lung cancer Mom has hx of some kind of malignancy Father > colon CA   Social History   Socioeconomic History   Marital status: Single    Spouse name: Not on file   Number of children: Not on file   Years of education: Not on file   Highest education level: Not on file  Occupational History   Not on file  Tobacco Use   Smoking status: Some Days    Types: Cigarettes   Smokeless tobacco: Never   Tobacco comments:    Quit in June 2022 per pt   Vaping Use   Vaping Use: Never used  Substance and Sexual Activity   Alcohol use: Yes    Comment: "pint of liquor a day" per pt - 09/29/20   Drug use: Never   Sexual activity: Not on file  Other Topics Concern   Not on file  Social History Narrative   Not on file   Social Determinants of Health   Financial Resource Strain: Not on file  Food Insecurity: Not on file  Transportation Needs: Not on file  Physical Activity: Not on  file  Stress: Not on file  Social Connections: Not on file  Intimate Partner Violence: Not on file    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  No military Alex Sims No TB exposure Remotely worked in Theme park manager.   No Known Allergies   Outpatient Medications Prior to Visit  Medication Sig Dispense Refill   albuterol (VENTOLIN HFA) 108 (90 Base) MCG/ACT inhaler Inhale 2 puffs into the lungs every 6 (six) hours as needed for wheezing or shortness of breath.     Pediatric Multiple Vitamins (MULTIVITAMIN CHILDRENS) CHEW Chew 2 each by mouth daily.     No facility-administered medications  prior to visit.        Objective:   Physical Exam Vitals:   11/22/20 1536  BP: 110/80  Pulse: 68  Temp: 97.6 F (36.4 C)  TempSrc: Oral  SpO2: 98%  Weight: 131 lb 3.2 oz (59.5 kg)  Height: 5\' 10"  (1.778 m)    Gen: Pleasant, thin, in no distress,  normal affect  ENT: No lesions,  mouth clear,  oropharynx clear, no postnasal drip  Neck: No JVD, no stridor  Lungs: No use of accessory muscles, no crackles or wheezing on normal respiration, no wheeze on forced expiration  Cardiovascular: RRR, heart sounds normal, no murmur or gallops, no peripheral edema  Musculoskeletal: No deformities, no cyanosis or clubbing  Neuro: alert, awake, non focal  Skin: Warm, no lesions or rash      Assessment & Plan:  Adenocarcinoma of left lung, stage 1 (HCC) His MRI brain and PET scan are reassuring.  His pulmonary function testing is normal and I do believe he can tolerate thoracic surgery.  Only potentially complicating issue is it looks like the left upper lobe nodule does reach out and possibly impact the pleura.  He has an appointment with Dr. Roxan Hockey on 10/4.  I would be happy to perform dye marking if it would be helpful for resection.  We reviewed your MRI of the brain, PET scan and pulmonary function testing today. Agree that you are a good candidate to discuss primary resection of your left upper lobe pulmonary nodule.  Keep your appointment with Dr. Roxan Hockey on 10/4 as planned. Follow with Dr. Lamonte Sakai as needed.   [name], MD, PhD 11/22/2020, 3:55 PM [facility] [phone] or if no answer before 7:00PM call [phone] For any issues after 7:00PM please call eLink [phone]

## 2020-11-22 NOTE — Telephone Encounter (Signed)
Forms completed by Sharl Ma and sent back to Dr. Lamonte Sakai for signature.  Patient needs to pay $29 fee.

## 2020-11-22 NOTE — Patient Instructions (Signed)
We reviewed your MRI of the brain, PET scan and pulmonary function testing today. Agree that you are a good candidate to discuss primary resection of your left upper lobe pulmonary nodule.  Keep your appointment with Dr. Roxan Hockey on 10/4 as planned. Follow with Dr. Lamonte Sakai as needed.

## 2020-11-22 NOTE — Assessment & Plan Note (Signed)
His MRI brain and PET scan are reassuring.  His pulmonary function testing is normal and I do believe he can tolerate thoracic surgery.  Only potentially complicating issue is it looks like the left upper lobe nodule does reach out and possibly impact the pleura.  He has an appointment with Dr. Roxan Hockey on 10/4.  I would be happy to perform dye marking if it would be helpful for resection.  We reviewed your MRI of the brain, PET scan and pulmonary function testing today. Agree that you are a good candidate to discuss primary resection of your left upper lobe pulmonary nodule.  Keep your appointment with Dr. Roxan Hockey on 10/4 as planned. Follow with Dr. Lamonte Sakai as needed.

## 2020-11-23 DIAGNOSIS — Z0289 Encounter for other administrative examinations: Secondary | ICD-10-CM

## 2020-11-24 ENCOUNTER — Telehealth: Payer: Self-pay | Admitting: *Deleted

## 2020-11-24 NOTE — Telephone Encounter (Signed)
Disability form signed by Dr. Lamonte Sakai on 9/29.  Patient will need to pay $29 fee before we fax this to insurance.

## 2020-11-24 NOTE — Telephone Encounter (Signed)
I called Alex Sims to remind him about his appt with Dr. Roxan Hockey on 10/4.  He verbalized understanding of appt and location.  I asked that he called me if he had other questions.

## 2020-11-24 NOTE — Telephone Encounter (Addendum)
Spoke with patient to inform that paperwork was completed. Patient requested forms be faxed to employer, [employer], [place]. at [phone]. Patient paid form fee, receipt mailed to patient and forms have been faxed. Patient did not want a copy of forms.

## 2020-11-28 ENCOUNTER — Other Ambulatory Visit: Payer: Self-pay

## 2020-11-28 ENCOUNTER — Institutional Professional Consult (permissible substitution) (INDEPENDENT_AMBULATORY_CARE_PROVIDER_SITE_OTHER): Payer: Commercial Managed Care - PPO | Admitting: Thoracic Surgery (Cardiothoracic Vascular Surgery)

## 2020-11-28 ENCOUNTER — Encounter: Payer: Self-pay | Admitting: Thoracic Surgery (Cardiothoracic Vascular Surgery)

## 2020-11-28 ENCOUNTER — Other Ambulatory Visit: Payer: Self-pay | Admitting: *Deleted

## 2020-11-28 ENCOUNTER — Encounter: Payer: Self-pay | Admitting: *Deleted

## 2020-11-28 VITALS — BP 120/81 | HR 66 | Resp 20 | Ht 70.0 in | Wt 132.0 lb

## 2020-11-28 DIAGNOSIS — R911 Solitary pulmonary nodule: Secondary | ICD-10-CM | POA: Diagnosis not present

## 2020-11-28 DIAGNOSIS — C3412 Malignant neoplasm of upper lobe, left bronchus or lung: Secondary | ICD-10-CM

## 2020-11-28 NOTE — H&P (View-Only) (Signed)
PCP is Pcp, No Referring Provider is Curt Bears, MD  Chief Complaint  Patient presents with   Lung Lesion    Surgical consult, PET Scan and MR Brain 10/31/20, Chest CT 09/05/20    HPI: Mr. Longest is sent for consultation regarding a left upper lobe adenocarcinoma.  Christan Defranco is a 45 year old man with a history of COVID, ethanol use and minimal tobacco use.  Back in December he had chest discomfort and coughing.  He was diagnosed with COVID.  He had recurrent symptoms in June.  He describes left parasternal pain.  Not exertional in nature.  He tested positive for COVID again.  A CT of the chest was done on June 19 to evaluate his chest discomfort.  There was a 2.6 x 2.2 cm left upper lobe lesion.  He was treated with Paxlovid, but stopped it due to side effects.  He saw Dr. Lamonte Sakai.  A follow-up CT showed a 3.1 cm left upper lobe nodule abutting the pleural surface with small satellite nodules.  Navigational bronchoscopy on 10/02/2020 showed adenocarcinoma.  He saw Dr. Julien Nordmann.  A PET/CT showed no evidence of regional or distant metastatic disease.  MRI of the brain showed no evidence of metastasis.  He says he smokes 1 to 2 cigarettes a week.  He stopped completely in June.  He drinks about a pint of alcohol a week.  He is not having any anginal type chest pain, pressure, or tightness.  He is not having any issues with shortness of breath.  He has lost about 6 pounds over the past 3 months.  He attributes that mainly to poor appetite due to anxiety.  He lives with his 40-year-old daughter.  He does have some family help close by.  He works as an Agricultural consultant at [employer].   No past medical history on file.  Past Surgical History:  Procedure Laterality Date   APPENDECTOMY     BRONCHIAL BIOPSY  10/02/2020   Procedure: BRONCHIAL BIOPSIES;  Surgeon: Collene Gobble, MD;  Location: [facility] ENDOSCOPY;  Service: Pulmonary;;   BRONCHIAL BRUSHINGS  10/02/2020   Procedure: BRONCHIAL BRUSHINGS;   Surgeon: Collene Gobble, MD;  Location: [facility] ENDOSCOPY;  Service: Pulmonary;;   BRONCHIAL NEEDLE ASPIRATION BIOPSY  10/02/2020   Procedure: BRONCHIAL NEEDLE ASPIRATION BIOPSIES;  Surgeon: Collene Gobble, MD;  Location: [facility];  Service: Pulmonary;;   BRONCHIAL WASHINGS  10/02/2020   Procedure: BRONCHIAL WASHINGS;  Surgeon: Collene Gobble, MD;  Location: [facility] ENDOSCOPY;  Service: Pulmonary;;   VIDEO BRONCHOSCOPY WITH ENDOBRONCHIAL NAVIGATION N/A 10/02/2020   Procedure: ROBOTIC BRONCHOSCOPY WITH ENDOBRONCHIAL NAVIGATION;  Surgeon: Collene Gobble, MD;  Location: [facility] ENDOSCOPY;  Service: Pulmonary;  Laterality: N/A;   VIDEO BRONCHOSCOPY WITH RADIAL ENDOBRONCHIAL ULTRASOUND  10/02/2020   Procedure: RADIAL ENDOBRONCHIAL ULTRASOUND;  Surgeon: Collene Gobble, MD;  Location: [facility] ENDOSCOPY;  Service: Pulmonary;;    No family history on file.  Social History Social History   Tobacco Use   Smoking status: Some Days    Types: Cigarettes   Smokeless tobacco: Never   Tobacco comments:    Quit in June 2022 per pt   Vaping Use   Vaping Use: Never used  Substance Use Topics   Alcohol use: Yes    Comment: "pint of liquor a day" per pt - 09/29/20   Drug use: Never    Current Outpatient Medications  Medication Sig Dispense Refill   Multiple Vitamin (MULTIVITAMIN WITH MINERALS) TABS tablet Take 1 tablet by  mouth daily.     albuterol (VENTOLIN HFA) 108 (90 Base) MCG/ACT inhaler Inhale 2 puffs into the lungs every 6 (six) hours as needed for wheezing or shortness of breath. (Patient not taking: Reported on 11/28/2020)     Pediatric Multiple Vitamins (MULTIVITAMIN CHILDRENS) CHEW Chew 2 each by mouth daily. (Patient not taking: Reported on 11/28/2020)     No current facility-administered medications for this visit.    No Known Allergies  Review of Systems  Constitutional:  Positive for unexpected weight change (has lost 6 lbs in 3 months).  HENT:  Negative for trouble swallowing and voice change.   Eyes:   Negative for visual disturbance.  Respiratory:  Positive for cough. Negative for shortness of breath.   Cardiovascular:  Positive for chest pain (Left parasternal, not exertional).  Gastrointestinal:  Negative for abdominal distention and abdominal pain.  Genitourinary:  Negative for difficulty urinating and dysuria.  Musculoskeletal:  Negative for arthralgias and gait problem.  Neurological:  Negative for seizures and headaches.  Hematological:  Negative for adenopathy.  Psychiatric/Behavioral:  The patient is nervous/anxious.   All other systems reviewed and are negative.  BP 120/81 (BP Location: Right Arm, Patient Position: Sitting, Cuff Size: Normal)   Pulse 66   Resp 20   Ht 5\' 10"  (1.778 m)   Wt 132 lb (59.9 kg)   SpO2 96% Comment: RA  BMI 18.94 kg/m  Physical Exam Vitals reviewed.  Constitutional:      General: He is not in acute distress.    Appearance: Normal appearance.  HENT:     Head: Normocephalic and atraumatic.  Eyes:     General: No scleral icterus.    Extraocular Movements: Extraocular movements intact.  Neck:     Vascular: No carotid bruit.  Cardiovascular:     Rate and Rhythm: Normal rate and regular rhythm.     Heart sounds: Normal heart sounds. No murmur heard.   No friction rub. No gallop.  Pulmonary:     Effort: Pulmonary effort is normal. No respiratory distress.     Breath sounds: Normal breath sounds. No wheezing or rales.  Abdominal:     General: There is no distension.     Palpations: Abdomen is soft.     Tenderness: There is no abdominal tenderness.  Musculoskeletal:     Cervical back: Neck supple.  Lymphadenopathy:     Cervical: No cervical adenopathy.  Skin:    General: Skin is warm and dry.  Neurological:     General: No focal deficit present.     Mental Status: He is alert and oriented to person, place, and time.     Cranial Nerves: No cranial nerve deficit.     Diagnostic Tests: CT CHEST WITHOUT CONTRAST    TECHNIQUE: Multidetector CT imaging of the chest was performed using thin slice collimation for electromagnetic bronchoscopy planning purposes, without intravenous contrast.   COMPARISON:  08/13/2020   FINDINGS: Cardiovascular: The heart size appears within normal limits. No pericardial effusion identified.   Mediastinum/Nodes: Normal appearance of the thyroid gland. The trachea appears patent and is midline. Normal appearance of the esophagus. No enlarged lymph nodes   Lungs/Pleura: No pleural effusion, airspace consolidation, or atelectasis. Persistent peripheral nodular density within the anterolateral left upper lobe. This measures 3.1 cm and is not significantly changed in the interval, image 35/3. Multiple surrounding satellite nodules are identified within the left upper lobe measuring up to 6 mm. No lung nodules identified within the left lower lobe or  right lung.   Upper Abdomen: No acute abnormality.   Musculoskeletal: No chest wall mass or suspicious bone lesions identified.   IMPRESSION: No significant change in the appearance of peripheral nodular density within the anterolateral left upper lobe with surrounding satellite nodules. Findings remain concerning for primary bronchogenic carcinoma.     Electronically Signed   By: Kerby Moors M.D.   On: 09/07/2020 09:13 NUCLEAR MEDICINE PET SKULL BASE TO THIGH   TECHNIQUE: 6.3 mCi F-18 FDG was injected intravenously. Full-ring PET imaging was performed from the skull base to thigh after the radiotracer. CT data was obtained and used for attenuation correction and anatomic localization.   Fasting blood glucose: 100 mg/dl   COMPARISON:  Chest CT dated September 05, 2020   FINDINGS: Mediastinal blood pool activity: SUV max 1.9   Liver activity: SUV max 2.6   NECK: No hypermetabolic lymph nodes in the neck.   Incidental CT findings: none   CHEST: Dominant left upper lobe lesion with an SUV max of 7.1,  is unchanged in size compared to prior exam. Satellite nodules in the left upper lobe demonstrate no FDG activity, likely due to small size, and are also unchanged in size. For example, satellite nodule on series 4, image 57 with SUV max of 0.4. No hypermetabolic mediastinal or hilar nodes.   Incidental CT findings: No pleural effusion. No nodules identified within the left lower lobe or right lung.   ABDOMEN/PELVIS: No abnormal hypermetabolic activity within the liver, pancreas, adrenal glands, or spleen. No hypermetabolic lymph nodes in the abdomen or pelvis.   Incidental CT findings: none   SKELETON: No focal hypermetabolic activity to suggest skeletal metastasis.   Incidental CT findings: Sclerotic lesion of the left iliac bone, likely a bone island.   IMPRESSION: Hypermetabolic peripheral nodule of the left upper lobe with surrounding satellite nodules, compatible with primary lung malignancy.   No evidence of metastatic disease in the chest, abdomen or pelvis.     Electronically Signed   By: Yetta Glassman M.D.   On: 10/31/2020 09:00   I personally reviewed the CT and PET/CT images.  There is a 3.1 cm left upper lobe nodule with a satellite nodule.  The dominant nodule is hypermetabolic.  No evidence of hilar or mediastinal adenopathy.  Pulmonary function testing FVC 4.82 (89%) FEV1 3.73 (88%) FEV1 3.90 (92%) postbronchodilator DLCO 30.71 (98%)  Impression: Patrick Salemi is a 45 year old man with a history of COVID, moderate ethanol use, and minimal tobacco use.  He was recently found to have a 3.1 cm left upper lobe nodule with multiple small satellite nodules.  Navigational bronchoscopy showed adenocarcinoma.  PET CT and MR were negative for metastatic disease.  Findings are consistent with a T3, N0, M0 stage IIb lesion.  I recommended to Mr. Riera that we proceed with surgical resection.  He understands we may recommend adjuvant chemotherapy mainly  based on whether the satellite nodules are in fact cancerous.  He also understands is a possibility we might have to resect a small portion of the chest wall, although based on the scans I think that is unlikely.  I described the proposed surgical procedure to him.  We will plan to do a robotic assisted left upper lobectomy.  I informed him of the general nature of the procedure including the incisions to be used, the use of general anesthesia, use of a drainage tube postoperatively, the expected hospital stay, and the overall recovery.  I informed him of the indications,  risks, benefits, and alternatives.  He understands the risks include, but not limited to death, MI, DVT, PE, bleeding, possible need for transfusion, infection, prolonged air leak, cardiac arrhythmias, as well as possibility of other unstable complications.  He accepts the risks and agrees to proceed.  Chest pain-atypical pain not consistent with angina.  No history of CAD or cardiac issues.  Plan: Robotic assisted left upper lobectomy on Friday, 12/08/2020  Melrose Nakayama, MD [facility] [phone]

## 2020-11-28 NOTE — Progress Notes (Signed)
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  Alex Sims is a 45 year old man with a history of COVID, ethanol use and minimal tobacco use.  Back in December he had chest discomfort and coughing.  He was diagnosed with COVID.  He had recurrent symptoms in June.  He describes left parasternal pain.  Not exertional in nature.  He tested positive for COVID again.  A CT of the chest was done on June 19 to evaluate his chest discomfort.  There was a 2.6 x 2.2 cm left upper lobe lesion.  He was treated with Paxlovid, but stopped it due to side effects.  He saw Dr. Lamonte Sakai.  A follow-up CT showed a 3.1 cm left upper lobe nodule abutting the pleural surface with small satellite nodules.  Navigational bronchoscopy on 10/02/2020 showed adenocarcinoma.  He saw Dr. Julien Nordmann.  A PET/CT showed no evidence of regional or distant metastatic disease.  MRI of the brain showed no evidence of metastasis.  He says he smokes 1 to 2 cigarettes a week.  He stopped completely in June.  He drinks about a pint of alcohol a week.  He is not having any anginal type chest pain, pressure, or tightness.  He is not having any issues with shortness of breath.  He has lost about 6 pounds over the past 3 months.  He attributes that mainly to poor appetite due to anxiety.  He lives with his 51-year-old daughter.  He does have some family help close by.  He works as an Agricultural consultant at [employer].   No past medical history on file.  Past Surgical History:  Procedure Laterality Date   APPENDECTOMY     BRONCHIAL BIOPSY  10/02/2020   Procedure: BRONCHIAL BIOPSIES;  Surgeon: Collene Gobble, MD;  Location: [facility] ENDOSCOPY;  Service: Pulmonary;;   BRONCHIAL BRUSHINGS  10/02/2020   Procedure: BRONCHIAL BRUSHINGS;   Surgeon: Collene Gobble, MD;  Location: [facility] ENDOSCOPY;  Service: Pulmonary;;   BRONCHIAL NEEDLE ASPIRATION BIOPSY  10/02/2020   Procedure: BRONCHIAL NEEDLE ASPIRATION BIOPSIES;  Surgeon: Collene Gobble, MD;  Location: [place];  Service: Pulmonary;;   BRONCHIAL WASHINGS  10/02/2020   Procedure: BRONCHIAL WASHINGS;  Surgeon: Collene Gobble, MD;  Location: [facility] ENDOSCOPY;  Service: Pulmonary;;   VIDEO BRONCHOSCOPY WITH ENDOBRONCHIAL NAVIGATION N/A 10/02/2020   Procedure: ROBOTIC BRONCHOSCOPY WITH ENDOBRONCHIAL NAVIGATION;  Surgeon: Collene Gobble, MD;  Location: [facility] ENDOSCOPY;  Service: Pulmonary;  Laterality: N/A;   VIDEO BRONCHOSCOPY WITH RADIAL ENDOBRONCHIAL ULTRASOUND  10/02/2020   Procedure: RADIAL ENDOBRONCHIAL ULTRASOUND;  Surgeon: Collene Gobble, MD;  Location: [facility] ENDOSCOPY;  Service: Pulmonary;;    No family history on file.  Social History Social History   Tobacco Use   Smoking status: Some Days    Types: Cigarettes   Smokeless tobacco: Never   Tobacco comments:    Quit in June 2022 per pt   Vaping Use   Vaping Use: Never used  Substance Use Topics   Alcohol use: Yes    Comment: "pint of liquor a day" per pt - 09/29/20   Drug use: Never    Current Outpatient Medications  Medication Sig Dispense Refill   Multiple Vitamin (MULTIVITAMIN WITH MINERALS) TABS tablet Take 1 tablet by  mouth daily.     albuterol (VENTOLIN HFA) 108 (90 Base) MCG/ACT inhaler Inhale 2 puffs into the lungs every 6 (six) hours as needed for wheezing or shortness of breath. (Patient not taking: Reported on 11/28/2020)     Pediatric Multiple Vitamins (MULTIVITAMIN CHILDRENS) CHEW Chew 2 each by mouth daily. (Patient not taking: Reported on 11/28/2020)     No current facility-administered medications for this visit.    No Known Allergies  Review of Systems  Constitutional:  Positive for unexpected weight change (has lost 6 lbs in 3 months).  HENT:  Negative for trouble swallowing and voice change.   Eyes:   Negative for visual disturbance.  Respiratory:  Positive for cough. Negative for shortness of breath.   Cardiovascular:  Positive for chest pain (Left parasternal, not exertional).  Gastrointestinal:  Negative for abdominal distention and abdominal pain.  Genitourinary:  Negative for difficulty urinating and dysuria.  Musculoskeletal:  Negative for arthralgias and gait problem.  Neurological:  Negative for seizures and headaches.  Hematological:  Negative for adenopathy.  Psychiatric/Behavioral:  The patient is nervous/anxious.   All other systems reviewed and are negative.  BP 120/81 (BP Location: Right Arm, Patient Position: Sitting, Cuff Size: Normal)   Pulse 66   Resp 20   Ht 5\' 10"  (1.778 m)   Wt 132 lb (59.9 kg)   SpO2 96% Comment: RA  BMI 18.94 kg/m  Physical Exam Vitals reviewed.  Constitutional:      General: He is not in acute distress.    Appearance: Normal appearance.  HENT:     Head: Normocephalic and atraumatic.  Eyes:     General: No scleral icterus.    Extraocular Movements: Extraocular movements intact.  Neck:     Vascular: No carotid bruit.  Cardiovascular:     Rate and Rhythm: Normal rate and regular rhythm.     Heart sounds: Normal heart sounds. No murmur heard.   No friction rub. No gallop.  Pulmonary:     Effort: Pulmonary effort is normal. No respiratory distress.     Breath sounds: Normal breath sounds. No wheezing or rales.  Abdominal:     General: There is no distension.     Palpations: Abdomen is soft.     Tenderness: There is no abdominal tenderness.  Musculoskeletal:     Cervical back: Neck supple.  Lymphadenopathy:     Cervical: No cervical adenopathy.  Skin:    General: Skin is warm and dry.  Neurological:     General: No focal deficit present.     Mental Status: He is alert and oriented to person, place, and time.     Cranial Nerves: No cranial nerve deficit.     Diagnostic Tests: CT CHEST WITHOUT CONTRAST    TECHNIQUE: Multidetector CT imaging of the chest was performed using thin slice collimation for electromagnetic bronchoscopy planning purposes, without intravenous contrast.   COMPARISON:  08/13/2020   FINDINGS: Cardiovascular: The heart size appears within normal limits. No pericardial effusion identified.   Mediastinum/Nodes: Normal appearance of the thyroid gland. The trachea appears patent and is midline. Normal appearance of the esophagus. No enlarged lymph nodes   Lungs/Pleura: No pleural effusion, airspace consolidation, or atelectasis. Persistent peripheral nodular density within the anterolateral left upper lobe. This measures 3.1 cm and is not significantly changed in the interval, image 35/3. Multiple surrounding satellite nodules are identified within the left upper lobe measuring up to 6 mm. No lung nodules identified within the left lower lobe or  right lung.   Upper Abdomen: No acute abnormality.   Musculoskeletal: No chest wall mass or suspicious bone lesions identified.   IMPRESSION: No significant change in the appearance of peripheral nodular density within the anterolateral left upper lobe with surrounding satellite nodules. Findings remain concerning for primary bronchogenic carcinoma.     Electronically Signed   By: Kerby Moors M.D.   On: 09/07/2020 09:13 NUCLEAR MEDICINE PET SKULL BASE TO THIGH   TECHNIQUE: 6.3 mCi F-18 FDG was injected intravenously. Full-ring PET imaging was performed from the skull base to thigh after the radiotracer. CT data was obtained and used for attenuation correction and anatomic localization.   Fasting blood glucose: 100 mg/dl   COMPARISON:  Chest CT dated September 05, 2020   FINDINGS: Mediastinal blood pool activity: SUV max 1.9   Liver activity: SUV max 2.6   NECK: No hypermetabolic lymph nodes in the neck.   Incidental CT findings: none   CHEST: Dominant left upper lobe lesion with an SUV max of 7.1,  is unchanged in size compared to prior exam. Satellite nodules in the left upper lobe demonstrate no FDG activity, likely due to small size, and are also unchanged in size. For example, satellite nodule on series 4, image 57 with SUV max of 0.4. No hypermetabolic mediastinal or hilar nodes.   Incidental CT findings: No pleural effusion. No nodules identified within the left lower lobe or right lung.   ABDOMEN/PELVIS: No abnormal hypermetabolic activity within the liver, pancreas, adrenal glands, or spleen. No hypermetabolic lymph nodes in the abdomen or pelvis.   Incidental CT findings: none   SKELETON: No focal hypermetabolic activity to suggest skeletal metastasis.   Incidental CT findings: Sclerotic lesion of the left iliac bone, likely a bone island.   IMPRESSION: Hypermetabolic peripheral nodule of the left upper lobe with surrounding satellite nodules, compatible with primary lung malignancy.   No evidence of metastatic disease in the chest, abdomen or pelvis.     Electronically Signed   By: Yetta Glassman M.D.   On: 10/31/2020 09:00   I personally reviewed the CT and PET/CT images.  There is a 3.1 cm left upper lobe nodule with a satellite nodule.  The dominant nodule is hypermetabolic.  No evidence of hilar or mediastinal adenopathy.  Pulmonary function testing FVC 4.82 (89%) FEV1 3.73 (88%) FEV1 3.90 (92%) postbronchodilator DLCO 30.71 (98%)  Impression: Alex Sims is a 45 year old man with a history of COVID, moderate ethanol use, and minimal tobacco use.  He was recently found to have a 3.1 cm left upper lobe nodule with multiple small satellite nodules.  Navigational bronchoscopy showed adenocarcinoma.  PET CT and MR were negative for metastatic disease.  Findings are consistent with a T3, N0, M0 stage IIb lesion.  I recommended to Mr. Canning that we proceed with surgical resection.  He understands we may recommend adjuvant chemotherapy mainly  based on whether the satellite nodules are in fact cancerous.  He also understands is a possibility we might have to resect a small portion of the chest wall, although based on the scans I think that is unlikely.  I described the proposed surgical procedure to him.  We will plan to do a robotic assisted left upper lobectomy.  I informed him of the general nature of the procedure including the incisions to be used, the use of general anesthesia, use of a drainage tube postoperatively, the expected hospital stay, and the overall recovery.  I informed him of the indications,  risks, benefits, and alternatives.  He understands the risks include, but not limited to death, MI, DVT, PE, bleeding, possible need for transfusion, infection, prolonged air leak, cardiac arrhythmias, as well as possibility of other unstable complications.  He accepts the risks and agrees to proceed.  Chest pain-atypical pain not consistent with angina.  No history of CAD or cardiac issues.  Plan: Robotic assisted left upper lobectomy on Friday, 12/08/2020  Melrose Nakayama, MD [facility] [phone]

## 2020-12-05 NOTE — Pre-Procedure Instructions (Signed)
Surgical Instructions   Your procedure is scheduled on Friday, October 14th. Report to [facility] Main Entrance "A" at 09:30 A.M., then check in with the Admitting office. Call this number if you have problems the morning of surgery: [phone]   If you have any questions prior to your surgery date call [phone]: Open Monday-Friday 8am-4pm   Remember: Do not eat or drink after midnight the night before your surgery     Take these medicines the morning of surgery with A SIP OF WATER  albuterol (VENTOLIN HFA)- if needed- bring with you on day of surgery  As of today, STOP taking any Aspirin (unless otherwise instructed by your surgeon) Aleve, Naproxen, Ibuprofen, Motrin, Advil, Goody's, BC's, all herbal medications, fish oil, and all vitamins.                     Do NOT Smoke (Tobacco/Vaping) or drink Alcohol 24 hours prior to your procedure.  If you use a CPAP at night, you may bring all equipment for your overnight stay.   Contacts, glasses, piercing's, hearing aid's, dentures or partials may not be worn into surgery, please bring cases for these belongings.    For patients admitted to the hospital, discharge time will be determined by your treatment team.   Patients discharged the day of surgery will not be allowed to drive home, and someone needs to stay with them for 24 hours.  NO VISITORS WILL BE ALLOWED IN PRE-OP WHERE PATIENTS GET READY FOR SURGERY.  ONLY 1 SUPPORT PERSON MAY BE PRESENT IN THE WAITING ROOM WHILE YOU ARE IN SURGERY.  IF YOU ARE TO BE ADMITTED, ONCE YOU ARE IN YOUR ROOM YOU WILL BE ALLOWED TWO (2) VISITORS.  Minor children may have two parents present. Special consideration for safety and communication needs will be reviewed on a case by case basis.   Special instructions:   [place]- Preparing For Surgery  Before surgery, you can play an important role. Because skin is not sterile, your skin needs to be as free of germs as possible. You can reduce  the number of germs on your skin by washing with CHG (chlorahexidine gluconate) Soap before surgery.  CHG is an antiseptic cleaner which kills germs and bonds with the skin to continue killing germs even after washing.    Oral Hygiene is also important to reduce your risk of infection.  Remember - BRUSH YOUR TEETH THE MORNING OF SURGERY WITH YOUR REGULAR TOOTHPASTE  Please do not use if you have an allergy to CHG or antibacterial soaps. If your skin becomes reddened/irritated stop using the CHG.  Do not shave (including legs and underarms) for at least 48 hours prior to first CHG shower. It is OK to shave your face.  Please follow these instructions carefully.   Shower the NIGHT BEFORE SURGERY and the MORNING OF SURGERY  If you chose to wash your hair, wash your hair first as usual with your normal shampoo.  After you shampoo, rinse your hair and body thoroughly to remove the shampoo.  Use CHG Soap as you would any other liquid soap. You can apply CHG directly to the skin and wash gently with a scrungie or a clean washcloth.   Apply the CHG Soap to your body ONLY FROM THE NECK DOWN.  Do not use on open wounds or open sores. Avoid contact with your eyes, ears, mouth and genitals (private parts). Wash Face and genitals (private parts)  with your normal soap.  Wash thoroughly, paying special attention to the area where your surgery will be performed.  Thoroughly rinse your body with warm water from the neck down.  DO NOT shower/wash with your normal soap after using and rinsing off the CHG Soap.  Pat yourself dry with a CLEAN TOWEL.  Wear CLEAN PAJAMAS to bed the night before surgery  Place CLEAN SHEETS on your bed the night before your surgery  DO NOT SLEEP WITH PETS.   Day of Surgery: Shower with CHG soap. Do not wear jewelry Do not wear lotions, powders, colognes, or deodorant. Men may shave face and neck. Do not bring valuables to the hospital. [facility] is not responsible  for any belongings or valuables. Wear Clean/Comfortable clothing the morning of surgery Remember to brush your teeth WITH YOUR REGULAR TOOTHPASTE.   Please read over the following fact sheets that you were given.   3 days prior to your procedure or After your COVID test   You are not required to quarantine however you are required to wear a well-fitting mask when you are out and around people not in your household. If your mask becomes wet or soiled, replace with a new one.   Wash your hands often with soap and water for 20 seconds or clean your hands with an alcohol-based hand sanitizer that contains at least 60% alcohol.   Do not share personal items.   Notify your provider:  o if you are in close contact with someone who has COVID  o or if you develop a fever of 100.4 or greater, sneezing, cough, sore throat, shortness of breath or body aches.

## 2020-12-06 ENCOUNTER — Encounter (HOSPITAL_COMMUNITY): Payer: Self-pay

## 2020-12-06 ENCOUNTER — Encounter (HOSPITAL_COMMUNITY)
Admission: RE | Admit: 2020-12-06 | Discharge: 2020-12-06 | Disposition: A | Discharge status: Home / Self Care | Payer: Commercial Managed Care - PPO | Source: Ambulatory Visit | Attending: Thoracic Surgery (Cardiothoracic Vascular Surgery) | Admitting: Thoracic Surgery (Cardiothoracic Vascular Surgery)

## 2020-12-06 ENCOUNTER — Ambulatory Visit (HOSPITAL_COMMUNITY)
Admission: RE | Admit: 2020-12-06 | Discharge: 2020-12-06 | Disposition: A | Discharge status: Home / Self Care | Payer: Commercial Managed Care - PPO | Source: Ambulatory Visit | Attending: Thoracic Surgery (Cardiothoracic Vascular Surgery) | Admitting: Thoracic Surgery (Cardiothoracic Vascular Surgery)

## 2020-12-06 ENCOUNTER — Telehealth: Payer: Self-pay | Admitting: *Deleted

## 2020-12-06 ENCOUNTER — Other Ambulatory Visit: Payer: Self-pay

## 2020-12-06 DIAGNOSIS — Z20822 Contact with and (suspected) exposure to covid-19: Secondary | ICD-10-CM | POA: Insufficient documentation

## 2020-12-06 DIAGNOSIS — C3412 Malignant neoplasm of upper lobe, left bronchus or lung: Secondary | ICD-10-CM | POA: Insufficient documentation

## 2020-12-06 DIAGNOSIS — Z01818 Encounter for other preprocedural examination: Secondary | ICD-10-CM | POA: Insufficient documentation

## 2020-12-06 DIAGNOSIS — Z736 Limitation of activities due to disability: Secondary | ICD-10-CM

## 2020-12-06 LAB — BLOOD GAS, ARTERIAL
Acid-base deficit: 0.1 mmol/L (ref 0.0–2.0)
Bicarbonate: 24.1 mmol/L (ref 20.0–28.0)
FIO2: 21
O2 Saturation: 98.3 %
Patient temperature: 37
pCO2 arterial: 40 mmHg (ref 32.0–48.0)
pH, Arterial: 7.397 (ref 7.350–7.450)
pO2, Arterial: 102 mmHg (ref 83.0–108.0)

## 2020-12-06 LAB — URINALYSIS, ROUTINE W REFLEX MICROSCOPIC
Bilirubin Urine: NEGATIVE
Glucose, UA: NEGATIVE mg/dL
Hgb urine dipstick: NEGATIVE
Ketones, ur: NEGATIVE mg/dL
Leukocytes,Ua: NEGATIVE
Nitrite: NEGATIVE
Protein, ur: NEGATIVE mg/dL
Specific Gravity, Urine: 1.024 (ref 1.005–1.030)
pH: 6 (ref 5.0–8.0)

## 2020-12-06 LAB — COMPREHENSIVE METABOLIC PANEL
ALT: 7 U/L (ref 0–44)
AST: 20 U/L (ref 15–41)
Albumin: 4.2 g/dL (ref 3.5–5.0)
Alkaline Phosphatase: 51 U/L (ref 38–126)
Anion gap: 9 (ref 5–15)
BUN: 17 mg/dL (ref 6–20)
CO2: 22 mmol/L (ref 22–32)
Calcium: 9.2 mg/dL (ref 8.9–10.3)
Chloride: 105 mmol/L (ref 98–111)
Creatinine, Ser: 0.71 mg/dL (ref 0.61–1.24)
GFR, Estimated: >60 mL/min (ref >60)
Glucose, Bld: 89 mg/dL (ref 70–99)
Potassium: 4.1 mmol/L (ref 3.5–5.1)
Sodium: 136 mmol/L (ref 135–145)
Total Bilirubin: 1.2 mg/dL (ref 0.3–1.2)
Total Protein: 7.2 g/dL (ref 6.5–8.1)

## 2020-12-06 LAB — PROTIME-INR
INR: 1.1 (ref 0.8–1.2)
Prothrombin Time: 14.3 seconds (ref 11.4–15.2)

## 2020-12-06 LAB — CBC
HCT: 44 % (ref 39.0–52.0)
Hemoglobin: 15.1 g/dL (ref 13.0–17.0)
MCH: 31.9 pg (ref 26.0–34.0)
MCHC: 34.3 g/dL (ref 30.0–36.0)
MCV: 93 fL (ref 80.0–100.0)
Platelets: 225 10*3/uL (ref 150–400)
RBC: 4.73 MIL/uL (ref 4.22–5.81)
RDW: 12.4 % (ref 11.5–15.5)
WBC: 5.1 10*3/uL (ref 4.0–10.5)
nRBC: 0 % (ref 0.0–0.2)

## 2020-12-06 LAB — APTT: aPTT: 30 seconds (ref 24–36)

## 2020-12-06 LAB — SURGICAL PCR SCREEN
MRSA, PCR: NEGATIVE
Staphylococcus aureus: POSITIVE — AB

## 2020-12-06 LAB — SARS CORONAVIRUS 2 (TAT 6-24 HRS): SARS Coronavirus 2: NEGATIVE

## 2020-12-06 NOTE — Progress Notes (Signed)
PCP - denies Cardiologist - denies  PPM/ICD - denies   Chest x-ray - 12/06/20 at PAT appt EKG - 12/06/20 at PAT appt Stress Test - denies ECHO - denies Cardiac Cath - denies  Sleep Study - denies   DM- denies  Blood Thinner Instructions: n/a Aspirin Instructions: n/a  ERAS Protcol - No, NPO   COVID TEST- 12/06/20 at PAT appt   Anesthesia review: No  Patient denies shortness of breath, fever, cough and chest pain at PAT appointment   All instructions explained to the patient, with a verbal understanding of the material. Patient agrees to go over the instructions while at home for a better understanding. Patient also instructed to wear a mask in public after being tested for COVID-19. The opportunity to ask questions was provided.

## 2020-12-06 NOTE — Telephone Encounter (Signed)
I called Alex Sims to see if he had any questions regarding his surgery scheduled on 10/14.  He states no questions at this time. I asked him if he has quit smoking and he stated yes he quit. I encouraged him to abstain from smoking.  I offered him encouragement regarding surgery.  He was thankful for the call.

## 2020-12-08 ENCOUNTER — Encounter (HOSPITAL_COMMUNITY): Payer: Self-pay | Admitting: Thoracic Surgery (Cardiothoracic Vascular Surgery)

## 2020-12-08 ENCOUNTER — Inpatient Hospital Stay (HOSPITAL_COMMUNITY): Payer: Commercial Managed Care - PPO

## 2020-12-08 ENCOUNTER — Encounter (HOSPITAL_COMMUNITY)
Admission: RE | Disposition: A | Discharge status: Home / Self Care | Payer: Self-pay | Source: Home / Self Care | Attending: Thoracic Surgery (Cardiothoracic Vascular Surgery)

## 2020-12-08 ENCOUNTER — Inpatient Hospital Stay (HOSPITAL_COMMUNITY): Payer: Commercial Managed Care - PPO | Admitting: Certified Registered"

## 2020-12-08 ENCOUNTER — Inpatient Hospital Stay (HOSPITAL_COMMUNITY)
Admission: RE | Admit: 2020-12-08 | Discharge: 2020-12-11 | DRG: 164 | Disposition: A | Discharge status: Home / Self Care | Payer: Commercial Managed Care - PPO | Attending: Thoracic Surgery (Cardiothoracic Vascular Surgery) | Admitting: Thoracic Surgery (Cardiothoracic Vascular Surgery)

## 2020-12-08 ENCOUNTER — Other Ambulatory Visit: Payer: Self-pay

## 2020-12-08 DIAGNOSIS — D6959 Other secondary thrombocytopenia: Secondary | ICD-10-CM | POA: Diagnosis present

## 2020-12-08 DIAGNOSIS — Z09 Encounter for follow-up examination after completed treatment for conditions other than malignant neoplasm: Secondary | ICD-10-CM

## 2020-12-08 DIAGNOSIS — Z902 Acquired absence of lung [part of]: Secondary | ICD-10-CM

## 2020-12-08 DIAGNOSIS — Z87891 Personal history of nicotine dependence: Secondary | ICD-10-CM

## 2020-12-08 DIAGNOSIS — J9383 Other pneumothorax: Secondary | ICD-10-CM | POA: Diagnosis not present

## 2020-12-08 DIAGNOSIS — Z8616 Personal history of COVID-19: Secondary | ICD-10-CM

## 2020-12-08 DIAGNOSIS — K219 Gastro-esophageal reflux disease without esophagitis: Secondary | ICD-10-CM | POA: Diagnosis present

## 2020-12-08 DIAGNOSIS — F419 Anxiety disorder, unspecified: Secondary | ICD-10-CM | POA: Diagnosis present

## 2020-12-08 DIAGNOSIS — D62 Acute posthemorrhagic anemia: Secondary | ICD-10-CM | POA: Diagnosis not present

## 2020-12-08 DIAGNOSIS — C3412 Malignant neoplasm of upper lobe, left bronchus or lung: Secondary | ICD-10-CM | POA: Diagnosis present

## 2020-12-08 HISTORY — PX: NODE DISSECTION: SHX5269

## 2020-12-08 HISTORY — PX: INTERCOSTAL NERVE BLOCK: SHX5021

## 2020-12-08 LAB — PREPARE RBC (CROSSMATCH)

## 2020-12-08 LAB — ABO/RH: ABO/RH(D): O POS

## 2020-12-08 SURGERY — LOBECTOMY, LUNG, ROBOT-ASSISTED, USING VATS
Anesthesia: General | Site: Chest | Laterality: Left

## 2020-12-08 MED ORDER — SODIUM CHLORIDE 0.9 % IV SOLN: INTRAVENOUS | Status: DC

## 2020-12-08 MED ORDER — SODIUM CHLORIDE FLUSH 0.9 % IV SOLN
INTRAVENOUS | Status: DC | PRN
  Administered 2020-12-08: 100 mL

## 2020-12-08 MED ORDER — PHENYLEPHRINE HCL-NACL 20-0.9 MG/250ML-% IV SOLN
INTRAVENOUS | Status: DC | PRN
  Administered 2020-12-08: 10 ug/min via INTRAVENOUS

## 2020-12-08 MED ORDER — EPHEDRINE SULFATE-NACL 50-0.9 MG/10ML-% IV SOSY
PREFILLED_SYRINGE | INTRAVENOUS | Status: DC | PRN
  Administered 2020-12-08: 5 mg via INTRAVENOUS

## 2020-12-08 MED ORDER — LIDOCAINE 2% (20 MG/ML) 5 ML SYRINGE
INTRAMUSCULAR | Status: DC | PRN
Start: 2020-12-08 — End: 2020-12-08
  Administered 2020-12-08: 40 mg via INTRAVENOUS

## 2020-12-08 MED ORDER — DEXAMETHASONE SODIUM PHOSPHATE 10 MG/ML IJ SOLN
INTRAMUSCULAR | Status: DC | PRN
  Administered 2020-12-08: 10 mg via INTRAVENOUS

## 2020-12-08 MED ORDER — ONDANSETRON HCL 4 MG/2ML IJ SOLN
4.0000 mg | Freq: Four times a day (QID) | INTRAMUSCULAR | Status: DC | PRN
  Administered 2020-12-08: 4 mg via INTRAVENOUS
  Filled 2020-12-08: qty 2

## 2020-12-08 MED ORDER — SODIUM CHLORIDE 0.9% IV SOLUTION: Freq: Once | INTRAVENOUS | Status: DC

## 2020-12-08 MED ORDER — MIDAZOLAM HCL 2 MG/2ML IJ SOLN
INTRAMUSCULAR | Status: AC
  Filled 2020-12-08: qty 2

## 2020-12-08 MED ORDER — FENTANYL CITRATE (PF) 250 MCG/5ML IJ SOLN
INTRAMUSCULAR | Status: AC
  Filled 2020-12-08: qty 5

## 2020-12-08 MED ORDER — SENNOSIDES-DOCUSATE SODIUM 8.6-50 MG PO TABS
1.0000 | ORAL_TABLET | Freq: Every day | ORAL | Status: DC
  Administered 2020-12-08 – 2020-12-10 (×3): 1 via ORAL
  Filled 2020-12-08 (×3): qty 1

## 2020-12-08 MED ORDER — MIDAZOLAM HCL 2 MG/2ML IJ SOLN
INTRAMUSCULAR | Status: AC
  Administered 2020-12-08: 2 mg via INTRAVENOUS
  Filled 2020-12-08: qty 2

## 2020-12-08 MED ORDER — FENTANYL CITRATE (PF) 250 MCG/5ML IJ SOLN
INTRAMUSCULAR | Status: DC | PRN
  Administered 2020-12-08 (×2): 50 ug via INTRAVENOUS
  Administered 2020-12-08: 100 ug via INTRAVENOUS
  Administered 2020-12-08: 50 ug via INTRAVENOUS

## 2020-12-08 MED ORDER — MIDAZOLAM HCL 2 MG/2ML IJ SOLN: 2.0000 mg | Freq: Once | INTRAMUSCULAR | Status: AC

## 2020-12-08 MED ORDER — ROCURONIUM BROMIDE 10 MG/ML (PF) SYRINGE
PREFILLED_SYRINGE | INTRAVENOUS | Status: DC | PRN
  Administered 2020-12-08: 50 mg via INTRAVENOUS
  Administered 2020-12-08: 40 mg via INTRAVENOUS
  Administered 2020-12-08: 60 mg via INTRAVENOUS

## 2020-12-08 MED ORDER — LACTATED RINGERS IV SOLN: INTRAVENOUS | Status: DC | PRN

## 2020-12-08 MED ORDER — MORPHINE SULFATE (PF) 2 MG/ML IV SOLN
2.0000 mg | INTRAVENOUS | Status: DC | PRN
  Administered 2020-12-08 – 2020-12-11 (×7): 2 mg via INTRAVENOUS
  Filled 2020-12-08 (×8): qty 1

## 2020-12-08 MED ORDER — PROPOFOL 10 MG/ML IV BOLUS
INTRAVENOUS | Status: DC | PRN
  Administered 2020-12-08: 150 mg via INTRAVENOUS
  Administered 2020-12-08: 50 mg via INTRAVENOUS

## 2020-12-08 MED ORDER — SODIUM CHLORIDE 0.9 % IR SOLN
Status: DC | PRN
Start: 2020-12-08 — End: 2020-12-08
  Administered 2020-12-08: 1000 mL

## 2020-12-08 MED ORDER — ONDANSETRON HCL 4 MG/2ML IJ SOLN
INTRAMUSCULAR | Status: DC | PRN
  Administered 2020-12-08: 4 mg via INTRAVENOUS

## 2020-12-08 MED ORDER — LACTATED RINGERS IV SOLN: INTRAVENOUS | Status: DC

## 2020-12-08 MED ORDER — HYDROMORPHONE HCL 1 MG/ML IJ SOLN: 0.2500 mg | INTRAMUSCULAR | Status: DC | PRN

## 2020-12-08 MED ORDER — AMISULPRIDE (ANTIEMETIC) 5 MG/2ML IV SOLN
10.0000 mg | Freq: Once | INTRAVENOUS | Status: AC | PRN
  Administered 2020-12-08: 10 mg via INTRAVENOUS

## 2020-12-08 MED ORDER — PROMETHAZINE HCL 25 MG/ML IJ SOLN: 6.2500 mg | INTRAMUSCULAR | Status: DC | PRN

## 2020-12-08 MED ORDER — GLYCOPYRROLATE PF 0.2 MG/ML IJ SOSY
PREFILLED_SYRINGE | INTRAMUSCULAR | Status: DC | PRN
  Administered 2020-12-08: .2 mg via INTRAVENOUS

## 2020-12-08 MED ORDER — OXYCODONE HCL 5 MG PO TABS
5.0000 mg | ORAL_TABLET | ORAL | Status: DC | PRN
  Administered 2020-12-08 – 2020-12-11 (×7): 10 mg via ORAL
  Filled 2020-12-08 (×7): qty 2

## 2020-12-08 MED ORDER — ACETAMINOPHEN 500 MG PO TABS
1000.0000 mg | ORAL_TABLET | Freq: Four times a day (QID) | ORAL | Status: DC
  Administered 2020-12-08 – 2020-12-10 (×9): 1000 mg via ORAL
  Filled 2020-12-08 (×10): qty 2

## 2020-12-08 MED ORDER — BUPIVACAINE LIPOSOME 1.3 % IJ SUSP
INTRAMUSCULAR | Status: AC
  Filled 2020-12-08: qty 20

## 2020-12-08 MED ORDER — BISACODYL 5 MG PO TBEC
10.0000 mg | DELAYED_RELEASE_TABLET | Freq: Every day | ORAL | Status: DC
  Filled 2020-12-08 (×2): qty 2

## 2020-12-08 MED ORDER — PHENYLEPHRINE HCL-NACL 20-0.9 MG/250ML-% IV SOLN: INTRAVENOUS | Status: DC | PRN

## 2020-12-08 MED ORDER — MEPERIDINE HCL 25 MG/ML IJ SOLN: 6.2500 mg | INTRAMUSCULAR | Status: DC | PRN

## 2020-12-08 MED ORDER — ORAL CARE MOUTH RINSE: 15.0000 mL | Freq: Once | OROMUCOSAL | Status: AC

## 2020-12-08 MED ORDER — SUGAMMADEX SODIUM 200 MG/2ML IV SOLN
INTRAVENOUS | Status: DC | PRN
  Administered 2020-12-08: 200 mg via INTRAVENOUS

## 2020-12-08 MED ORDER — ACETAMINOPHEN 10 MG/ML IV SOLN: 1000.0000 mg | Freq: Once | INTRAVENOUS | Status: DC | PRN

## 2020-12-08 MED ORDER — BUPIVACAINE HCL (PF) 0.5 % IJ SOLN
INTRAMUSCULAR | Status: AC
  Filled 2020-12-08: qty 30

## 2020-12-08 MED ORDER — CEFAZOLIN SODIUM-DEXTROSE 2-4 GM/100ML-% IV SOLN
2.0000 g | INTRAVENOUS | Status: AC
  Administered 2020-12-08: 2 g via INTRAVENOUS
  Filled 2020-12-08: qty 100

## 2020-12-08 MED ORDER — PHENYLEPHRINE 40 MCG/ML (10ML) SYRINGE FOR IV PUSH (FOR BLOOD PRESSURE SUPPORT)
PREFILLED_SYRINGE | INTRAVENOUS | Status: DC | PRN
  Administered 2020-12-08: 40 ug via INTRAVENOUS
  Administered 2020-12-08: 160 ug via INTRAVENOUS

## 2020-12-08 MED ORDER — HEMOSTATIC AGENTS (NO CHARGE) OPTIME
TOPICAL | Status: DC | PRN
  Administered 2020-12-08 (×2): 1 via TOPICAL

## 2020-12-08 MED ORDER — CEFAZOLIN SODIUM-DEXTROSE 2-4 GM/100ML-% IV SOLN
2.0000 g | Freq: Three times a day (TID) | INTRAVENOUS | Status: AC
Start: 2020-12-08 — End: 2020-12-09
  Administered 2020-12-08 – 2020-12-09 (×2): 2 g via INTRAVENOUS
  Filled 2020-12-08 (×2): qty 100

## 2020-12-08 MED ORDER — ACETAMINOPHEN 160 MG/5ML PO SOLN: 325.0000 mg | Freq: Once | ORAL | Status: DC | PRN

## 2020-12-08 MED ORDER — AMISULPRIDE (ANTIEMETIC) 5 MG/2ML IV SOLN
INTRAVENOUS | Status: AC
  Filled 2020-12-08: qty 4

## 2020-12-08 MED ORDER — FENTANYL CITRATE (PF) 100 MCG/2ML IJ SOLN: 100.0000 ug | Freq: Once | INTRAMUSCULAR | Status: AC

## 2020-12-08 MED ORDER — KETOROLAC TROMETHAMINE 15 MG/ML IJ SOLN
30.0000 mg | Freq: Four times a day (QID) | INTRAMUSCULAR | Status: AC
  Administered 2020-12-08 – 2020-12-10 (×8): 30 mg via INTRAVENOUS
  Filled 2020-12-08 (×8): qty 2

## 2020-12-08 MED ORDER — PROPOFOL 10 MG/ML IV BOLUS
INTRAVENOUS | Status: AC
  Filled 2020-12-08: qty 20

## 2020-12-08 MED ORDER — FENTANYL CITRATE (PF) 100 MCG/2ML IJ SOLN
INTRAMUSCULAR | Status: AC
  Administered 2020-12-08: 100 ug via INTRAVENOUS
  Filled 2020-12-08: qty 2

## 2020-12-08 MED ORDER — ENOXAPARIN SODIUM 40 MG/0.4ML IJ SOSY
40.0000 mg | PREFILLED_SYRINGE | Freq: Every day | INTRAMUSCULAR | Status: DC
  Administered 2020-12-09 – 2020-12-11 (×2): 40 mg via SUBCUTANEOUS
  Filled 2020-12-08 (×3): qty 0.4

## 2020-12-08 MED ORDER — ACETAMINOPHEN 160 MG/5ML PO SOLN
1000.0000 mg | Freq: Four times a day (QID) | ORAL | Status: DC
  Filled 2020-12-08: qty 40.6

## 2020-12-08 MED ORDER — ACETAMINOPHEN 325 MG PO TABS: 325.0000 mg | ORAL_TABLET | Freq: Once | ORAL | Status: DC | PRN

## 2020-12-08 MED ORDER — CHLORHEXIDINE GLUCONATE 0.12 % MT SOLN
15.0000 mL | Freq: Once | OROMUCOSAL | Status: AC
  Administered 2020-12-08: 15 mL via OROMUCOSAL
  Filled 2020-12-08: qty 15

## 2020-12-08 MED ORDER — 0.9 % SODIUM CHLORIDE (POUR BTL) OPTIME
TOPICAL | Status: DC | PRN
  Administered 2020-12-08: 1000 mL

## 2020-12-08 SURGICAL SUPPLY — 113 items
ADH SKN CLS APL DERMABOND .7 (GAUZE/BANDAGES/DRESSINGS) ×1
BLADE CLIPPER SURG (BLADE) IMPLANT
CANISTER SUCT 3000ML PPV (MISCELLANEOUS) ×4 IMPLANT
CANNULA REDUC XI 12-8 STAPL (CANNULA) ×4
CANNULA REDUCER 12-8 DVNC XI (CANNULA) ×2 IMPLANT
CATH THORACIC 28FR (CATHETERS) IMPLANT
CATH THORACIC 28FR RT ANG (CATHETERS) IMPLANT
CATH THORACIC 36FR (CATHETERS) IMPLANT
CATH THORACIC 36FR RT ANG (CATHETERS) IMPLANT
CLIP VESOCCLUDE MED 6/CT (CLIP) IMPLANT
CNTNR URN SCR LID CUP LEK RST (MISCELLANEOUS) ×5 IMPLANT
CONN ST 1/4X3/8  BEN (MISCELLANEOUS) ×2
CONN ST 1/4X3/8 BEN (MISCELLANEOUS) IMPLANT
CONN Y 3/8X3/8X3/8  BEN (MISCELLANEOUS)
CONN Y 3/8X3/8X3/8 BEN (MISCELLANEOUS) IMPLANT
CONT SPEC 4OZ STRL OR WHT (MISCELLANEOUS) ×18
DEFOGGER SCOPE WARMER CLEARIFY (MISCELLANEOUS) ×2 IMPLANT
DERMABOND ADVANCED (GAUZE/BANDAGES/DRESSINGS) ×1
DERMABOND ADVANCED .7 DNX12 (GAUZE/BANDAGES/DRESSINGS) ×1 IMPLANT
DRAIN CHANNEL 28F RND 3/8 FF (WOUND CARE) ×1 IMPLANT
DRAIN CHANNEL 32F RND 10.7 FF (WOUND CARE) IMPLANT
DRAPE ARM DVNC X/XI (DISPOSABLE) ×4 IMPLANT
DRAPE COLUMN DVNC XI (DISPOSABLE) ×1 IMPLANT
DRAPE CV SPLIT W-CLR ANES SCRN (DRAPES) ×2 IMPLANT
DRAPE DA VINCI XI ARM (DISPOSABLE) ×8
DRAPE DA VINCI XI COLUMN (DISPOSABLE) ×2
DRAPE HALF SHEET 40X57 (DRAPES) ×2 IMPLANT
DRAPE INCISE IOBAN 66X45 STRL (DRAPES) IMPLANT
DRAPE ORTHO SPLIT 77X108 STRL (DRAPES) ×2
DRAPE SURG ORHT 6 SPLT 77X108 (DRAPES) ×1 IMPLANT
ELECT BLADE 6.5 EXT (BLADE) ×2 IMPLANT
ELECT REM PT RETURN 9FT ADLT (ELECTROSURGICAL) ×2
ELECTRODE REM PT RTRN 9FT ADLT (ELECTROSURGICAL) ×1 IMPLANT
GAUZE KITTNER 4X5 RF (MISCELLANEOUS) ×5 IMPLANT
GAUZE SPONGE 4X4 12PLY STRL (GAUZE/BANDAGES/DRESSINGS) ×2 IMPLANT
GLOVE SURG POLYISO LF SZ8 (GLOVE) ×4 IMPLANT
GLOVE SURG SYN 7.5  E (GLOVE)
GLOVE SURG SYN 7.5 E (GLOVE) IMPLANT
GLOVE SURG SYN 7.5 PF PI (GLOVE) IMPLANT
GLOVE TRIUMPH SURG SIZE 7.5 (KITS) IMPLANT
GOWN STRL REUS W/ TWL LRG LVL3 (GOWN DISPOSABLE) ×2 IMPLANT
GOWN STRL REUS W/ TWL XL LVL3 (GOWN DISPOSABLE) ×2 IMPLANT
GOWN STRL REUS W/TWL 2XL LVL3 (GOWN DISPOSABLE) ×2 IMPLANT
GOWN STRL REUS W/TWL LRG LVL3 (GOWN DISPOSABLE) ×4
GOWN STRL REUS W/TWL XL LVL3 (GOWN DISPOSABLE) ×4
HEMOSTAT SURGICEL 2X14 (HEMOSTASIS) ×6 IMPLANT
IRRIGATION STRYKERFLOW (MISCELLANEOUS) ×1 IMPLANT
IRRIGATOR STRYKERFLOW (MISCELLANEOUS) ×2
KIT BASIN OR (CUSTOM PROCEDURE TRAY) ×2 IMPLANT
KIT SUCTION CATH 14FR (SUCTIONS) IMPLANT
KIT TURNOVER KIT B (KITS) ×2 IMPLANT
LOOP VESSEL SUPERMAXI WHITE (MISCELLANEOUS) IMPLANT
NDL HYPO 25GX1X1/2 BEV (NEEDLE) ×1 IMPLANT
NDL SPNL 22GX3.5 QUINCKE BK (NEEDLE) ×1 IMPLANT
NEEDLE HYPO 25GX1X1/2 BEV (NEEDLE) ×2 IMPLANT
NEEDLE SPNL 22GX3.5 QUINCKE BK (NEEDLE) ×2 IMPLANT
NS IRRIG 1000ML POUR BTL (IV SOLUTION) ×4 IMPLANT
PACK CHEST (CUSTOM PROCEDURE TRAY) ×2 IMPLANT
PAD ARMBOARD 7.5X6 YLW CONV (MISCELLANEOUS) ×4 IMPLANT
PORT ACCESS TROCAR AIRSEAL 12 (TROCAR) ×1 IMPLANT
PORT ACCESS TROCAR AIRSEAL 5M (TROCAR) ×1
RELOAD STAPLE 45 2.5 WHT DVNC (STAPLE) IMPLANT
RELOAD STAPLE 45 3.5 BLU DVNC (STAPLE) IMPLANT
RELOAD STAPLE 45 4.3 GRN DVNC (STAPLE) IMPLANT
RELOAD STAPLER 2.5X45 WHT DVNC (STAPLE) ×6 IMPLANT
RELOAD STAPLER 3.5X45 BLU DVNC (STAPLE) ×7 IMPLANT
RELOAD STAPLER 4.3X45 GRN DVNC (STAPLE) ×1 IMPLANT
SCISSORS LAP 5X35 DISP (ENDOMECHANICALS) IMPLANT
SEAL CANN UNIV 5-8 DVNC XI (MISCELLANEOUS) ×2 IMPLANT
SEAL XI 5MM-8MM UNIVERSAL (MISCELLANEOUS) ×4
SEALANT PROGEL (MISCELLANEOUS) IMPLANT
SEALANT SURG COSEAL 4ML (VASCULAR PRODUCTS) IMPLANT
SEALANT SURG COSEAL 8ML (VASCULAR PRODUCTS) IMPLANT
SET TRI-LUMEN FLTR TB AIRSEAL (TUBING) ×2 IMPLANT
SHEARS HARMONIC HDI 20CM (ELECTROSURGICAL) IMPLANT
SOLUTION ELECTROLUBE (MISCELLANEOUS) ×2 IMPLANT
SPONGE INTESTINAL PEANUT (DISPOSABLE) IMPLANT
SPONGE TONSIL TAPE 1 RFD (DISPOSABLE) IMPLANT
STAPLER 45 SUREFORM CVD (STAPLE) ×4
STAPLER 45 SUREFORM CVD DVNC (STAPLE) IMPLANT
STAPLER CANNULA SEAL DVNC XI (STAPLE) ×2 IMPLANT
STAPLER CANNULA SEAL XI (STAPLE) ×4
STAPLER RELOAD 2.5X45 WHITE (STAPLE) ×12
STAPLER RELOAD 2.5X45 WHT DVNC (STAPLE) ×6
STAPLER RELOAD 3.5X45 BLU DVNC (STAPLE) ×7
STAPLER RELOAD 3.5X45 BLUE (STAPLE) ×14
STAPLER RELOAD 4.3X45 GREEN (STAPLE) ×2
STAPLER RELOAD 4.3X45 GRN DVNC (STAPLE) ×1
SUT PDS AB 3-0 SH 27 (SUTURE) IMPLANT
SUT PROLENE 4 0 RB 1 (SUTURE)
SUT PROLENE 4-0 RB1 .5 CRCL 36 (SUTURE) IMPLANT
SUT SILK  1 MH (SUTURE) ×2
SUT SILK 1 MH (SUTURE) ×1 IMPLANT
SUT SILK 1 TIES 10X30 (SUTURE) ×2 IMPLANT
SUT SILK 2 0 SH (SUTURE) ×3 IMPLANT
SUT SILK 2 0SH CR/8 30 (SUTURE) IMPLANT
SUT SILK 3 0SH CR/8 30 (SUTURE) IMPLANT
SUT VIC AB 1 CTX 36 (SUTURE) ×2
SUT VIC AB 1 CTX36XBRD ANBCTR (SUTURE) ×1 IMPLANT
SUT VIC AB 2-0 CTX 36 (SUTURE) ×2 IMPLANT
SUT VIC AB 3-0 MH 27 (SUTURE) IMPLANT
SUT VIC AB 3-0 X1 27 (SUTURE) ×4 IMPLANT
SUT VICRYL 0 TIES 12 18 (SUTURE) ×2 IMPLANT
SUT VICRYL 0 UR6 27IN ABS (SUTURE) ×4 IMPLANT
SUT VICRYL 2 TP 1 (SUTURE) IMPLANT
SYR 20CC LL (SYRINGE) ×4 IMPLANT
SYSTEM SAHARA CHEST DRAIN ATS (WOUND CARE) ×2 IMPLANT
TAPE CLOTH 4X10 WHT NS (GAUZE/BANDAGES/DRESSINGS) ×2 IMPLANT
TAPE CLOTH SURG 4X10 WHT LF (GAUZE/BANDAGES/DRESSINGS) ×1 IMPLANT
TIP APPLICATOR SPRAY EXTEND 16 (VASCULAR PRODUCTS) IMPLANT
TOWEL GREEN STERILE (TOWEL DISPOSABLE) ×2 IMPLANT
TRAY FOLEY MTR SLVR 16FR STAT (SET/KITS/TRAYS/PACK) ×2 IMPLANT
WATER STERILE IRR 1000ML POUR (IV SOLUTION) ×4 IMPLANT

## 2020-12-08 NOTE — Progress Notes (Signed)
Admission from the PACU by bed awake and alert.

## 2020-12-08 NOTE — Interval H&P Note (Signed)
History and Physical Interval Note:  12/08/2020 11:24 AM  Alex Sims  has presented today for surgery, with the diagnosis of LUL ADENOCARCINOMA.  The various methods of treatment have been discussed with the patient and family. After consideration of risks, benefits and other options for treatment, the patient has consented to  Procedure(s): XI ROBOTIC ASSISTED THORASCOPY-LEFT UPPER LOBECTOMY (Left) as a surgical intervention.  The patient's history has been reviewed, patient examined, no change in status, stable for surgery.  I have reviewed the patient's chart and labs.  Questions were answered to the patient's satisfaction.     Melrose Nakayama

## 2020-12-08 NOTE — Anesthesia Procedure Notes (Signed)
Procedure Name: Intubation Date/Time: 12/08/2020 1:38 PM Performed by: Griffin Dakin, CRNA Pre-anesthesia Checklist: Patient identified, Emergency Drugs available, Suction available and Patient being monitored Patient Re-evaluated:Patient Re-evaluated prior to induction Oxygen Delivery Method: Circle system utilized Preoxygenation: Pre-oxygenation with 100% oxygen Induction Type: IV induction Ventilation: Mask ventilation without difficulty Laryngoscope Size: Mac and 4 Grade View: Grade I Tube type: Oral Endobronchial tube: Left, Double lumen EBT, EBT position confirmed by auscultation and EBT position confirmed by fiberoptic bronchoscope and 41 Fr Number of attempts: 1 Airway Equipment and Method: Stylet Placement Confirmation: ETT inserted through vocal cords under direct vision, positive ETCO2 and breath sounds checked- equal and bilateral Tube secured with: Tape Dental Injury: Teeth and Oropharynx as per pre-operative assessment

## 2020-12-08 NOTE — Anesthesia Procedure Notes (Signed)
Central Venous Catheter Insertion Performed by: Effie Berkshire, MD, anesthesiologist Start/End10/14/2022 11:30 AM, 12/08/2020 11:40 AM Patient location: Pre-op. Preanesthetic checklist: patient identified, IV checked, site marked, risks and benefits discussed, surgical consent, monitors and equipment checked, pre-op evaluation, timeout performed and anesthesia consent Position: Trendelenburg Lidocaine 1% used for infiltration and patient sedated Hand hygiene performed , maximum sterile barriers used  and Seldinger technique used Catheter size: 8 Fr Total catheter length 16. Central line was placed.Triple lumen Procedure performed using ultrasound guided technique. Ultrasound Notes:anatomy identified, needle tip was noted to be adjacent to the nerve/plexus identified, no ultrasound evidence of intravascular and/or intraneural injection and image(s) printed for medical record Attempts: 1 Following insertion, dressing applied, line sutured and Biopatch. Post procedure assessment: blood return through all ports  Patient tolerated the procedure well with no immediate complications.

## 2020-12-08 NOTE — Anesthesia Procedure Notes (Signed)
Arterial Line Insertion Start/End10/14/2022 10:50 AM, 12/08/2020 10:53 AM Performed by: Inda Coke, CRNA, CRNA  Patient location: Pre-op. Preanesthetic checklist: patient identified, IV checked, site marked, risks and benefits discussed, surgical consent, monitors and equipment checked, pre-op evaluation, timeout performed and anesthesia consent Lidocaine 1% used for infiltration Right, radial was placed Catheter size: 20 G Hand hygiene performed  and maximum sterile barriers used  Allen's test indicative of satisfactory collateral circulation Attempts: 1 Procedure performed without using ultrasound guided technique. Post procedure assessment: normal  Patient tolerated the procedure well with no immediate complications.

## 2020-12-08 NOTE — Anesthesia Preprocedure Evaluation (Signed)
Anesthesia Evaluation  Patient identified by MRN, date of birth, ID band Patient awake    Reviewed: Allergy & Precautions, NPO status , Patient's Chart, lab work & pertinent test results  Airway Mallampati: I  TM Distance: >3 FB Neck ROM: Full    Dental  (+) Teeth Intact, Dental Advisory Given   Pulmonary former smoker,    breath sounds clear to auscultation       Cardiovascular negative cardio ROS   Rhythm:Regular Rate:Normal     Neuro/Psych negative neurological ROS  negative psych ROS   GI/Hepatic negative GI ROS, Neg liver ROS,   Endo/Other  negative endocrine ROS  Renal/GU negative Renal ROS     Musculoskeletal negative musculoskeletal ROS (+)   Abdominal Normal abdominal exam  (+)   Peds  Hematology negative hematology ROS (+)   Anesthesia Other Findings   Reproductive/Obstetrics                             Anesthesia Physical Anesthesia Plan  ASA: 2  Anesthesia Plan: General   Post-op Pain Management:    Induction: Intravenous  PONV Risk Score and Plan: 3 and Ondansetron, Dexamethasone and Midazolam  Airway Management Planned: Double Lumen EBT  Additional Equipment: Arterial line, Ultrasound Guidance Line Placement and CVP  Intra-op Plan:   Post-operative Plan: Extubation in OR  Informed Consent: I have reviewed the patients History and Physical, chart, labs and discussed the procedure including the risks, benefits and alternatives for the proposed anesthesia with the patient or authorized representative who has indicated his/her understanding and acceptance.     Dental advisory given  Plan Discussed with: CRNA  Anesthesia Plan Comments:         Anesthesia Quick Evaluation

## 2020-12-08 NOTE — Discharge Instructions (Signed)

## 2020-12-08 NOTE — Brief Op Note (Signed)
12/08/2020  4:01 PM  PATIENT:  Gracelyn Nurse  45 y.o. male  PRE-OPERATIVE DIAGNOSIS:  LEFT UPPER LOBE ADENOCARCINOMA  POST-OPERATIVE DIAGNOSIS:  LEFT UPPER LOBE ADENOCARCINOMA  PROCEDURE:  Procedure(s): XI ROBOTIC ASSISTED THORASCOPY-LEFT UPPER LOBECTOMY (Left) NODE DISSECTION (Left) INTERCOSTAL NERVE BLOCK (Left)  SURGEON:  Surgeon(s) and Role:    Melrose Nakayama, MD - Primary  PHYSICIAN ASSISTANT: Nykerria Macconnell  ANESTHESIA:   general  EBL:  100 mL   BLOOD ADMINISTERED:none  DRAINS:  Left 52fr Pleural Blake Drain    LOCAL MEDICATIONS USED:  Exparel  SPECIMEN:  Left upper lung lobe, multiple lymph nodes  DISPOSITION OF SPECIMEN:  PATHOLOGY  COUNTS:  YES  DICTATION: .Dragon Dictation  PLAN OF CARE: Admit to inpatient   PATIENT DISPOSITION:  PACU - hemodynamically stable.   Delay start of Pharmacological VTE agent (>24hrs) due to surgical blood loss or risk of bleeding: yes

## 2020-12-08 NOTE — Hospital Course (Addendum)
History of Present Illness  Alex Sims is a 45 year old man with a history of COVID, ethanol use and minimal tobacco use.  Back in December he had chest discomfort and coughing.  He was diagnosed with COVID.  He had recurrent symptoms in June.  He describes left parasternal pain.  Not exertional in nature.  He tested positive for COVID again.  A CT of the chest was done on June 19 to evaluate his chest discomfort.  There was a 2.6 x 2.2 cm left upper lobe lesion.  He was treated with Paxlovid, but stopped it due to side effects.  He saw Dr. Lamonte Sakai.  A follow-up CT showed a 3.1 cm left upper lobe nodule abutting the pleural surface with small satellite nodules.  Navigational bronchoscopy on 10/02/2020 showed adenocarcinoma.  He saw Dr. Julien Nordmann.  A PET/CT showed no evidence of regional or distant metastatic disease.  MRI of the brain showed no evidence of metastasis.  He says he smokes 1 to 2 cigarettes a week.  He stopped completely in June.  He drinks about a pint of alcohol a week.  He is not having any anginal type chest pain, pressure, or tightness.  He is not having any issues with shortness of breath.  He has lost about 6 pounds over the past 3 months.  He attributes that mainly to poor appetite due to anxiety.  He lives with his 16-year-old daughter.  He does have some family help close by.  He works as an Agricultural consultant at [employer].  Left upper lobectomy was offered [facility] [place] by Dr. Roxan Hockey and the patient decided to proceed.  Course in Hospital:  Alex Sims was admitted for elective surgery on 12/08/20 and taken to the OR where robotic-assisted left upper lobectomy was performed without complication. Intra-operative frozen section on the bronchial margin was negative for malignancy. Following the procedure, he was taken to the PACU in stable condition.   Postoperative hospital course:  The patient has done well.  He has maintained stable hemodynamics.  He does have a sinus  bradycardia.  He does have an expected acute blood loss anemia which is stable.  Chest tube drainage has slowed over the first 2 days.  He has a small apical pneumothorax/space.  The chest tube was clamped on postoperative #2 with plans for possible removal if no change in size of pneumothorax/space.  He does not have any air leak.  He is maintaining normal renal function and good urine output.  He is tolerating gradually increasing activities using standard rehabilitation protocols.  Oxygen was weaned and he maintains good saturations on room air.

## 2020-12-08 NOTE — Transfer of Care (Signed)
Immediate Anesthesia Transfer of Care Note  Patient: Alex Sims  Procedure(s) Performed: XI ROBOTIC ASSISTED THORASCOPY-LEFT UPPER LOBECTOMY (Left: Chest) NODE DISSECTION (Left: Chest) INTERCOSTAL NERVE BLOCK (Left: Chest)  Patient Location: PACU  Anesthesia Type:General  Level of Consciousness: awake, alert  and oriented  Airway & Oxygen Therapy: Patient Spontanous Breathing and Patient connected to nasal cannula oxygen  Post-op Assessment: Report given to RN and Post -op Vital signs reviewed and stable  Post vital signs: Reviewed and stable  Last Vitals:  Vitals Value Taken Time  BP 112/74 12/08/20 1616  Temp    Pulse 59 12/08/20 1621  Resp 11 12/08/20 1621  SpO2 97 % 12/08/20 1621  Vitals shown include unvalidated device data.  Last Pain:  Vitals:   12/08/20 1004  TempSrc:   PainSc: 0-No pain         Complications: No notable events documented.

## 2020-12-09 ENCOUNTER — Inpatient Hospital Stay (HOSPITAL_COMMUNITY): Payer: Commercial Managed Care - PPO

## 2020-12-09 LAB — BASIC METABOLIC PANEL
Anion gap: 5 (ref 5–15)
BUN: 9 mg/dL (ref 6–20)
CO2: 25 mmol/L (ref 22–32)
Calcium: 7.9 mg/dL — ABNORMAL LOW (ref 8.9–10.3)
Chloride: 102 mmol/L (ref 98–111)
Creatinine, Ser: 0.87 mg/dL (ref 0.61–1.24)
GFR, Estimated: >60 mL/min (ref >60)
Glucose, Bld: 187 mg/dL — ABNORMAL HIGH (ref 70–99)
Potassium: 4.1 mmol/L (ref 3.5–5.1)
Sodium: 132 mmol/L — ABNORMAL LOW (ref 135–145)

## 2020-12-09 LAB — CBC
HCT: 35.7 % — ABNORMAL LOW (ref 39.0–52.0)
Hemoglobin: 12.2 g/dL — ABNORMAL LOW (ref 13.0–17.0)
MCH: 32.4 pg (ref 26.0–34.0)
MCHC: 34.2 g/dL (ref 30.0–36.0)
MCV: 94.7 fL (ref 80.0–100.0)
Platelets: 182 10*3/uL (ref 150–400)
RBC: 3.77 MIL/uL — ABNORMAL LOW (ref 4.22–5.81)
RDW: 12.4 % (ref 11.5–15.5)
WBC: 9.8 10*3/uL (ref 4.0–10.5)
nRBC: 0 % (ref 0.0–0.2)

## 2020-12-09 MED ORDER — CHLORHEXIDINE GLUCONATE CLOTH 2 % EX PADS
6.0000 | MEDICATED_PAD | Freq: Every day | CUTANEOUS | Status: DC
  Administered 2020-12-09 – 2020-12-11 (×3): 6 via TOPICAL

## 2020-12-09 MED ORDER — PANTOPRAZOLE SODIUM 40 MG PO TBEC
40.0000 mg | DELAYED_RELEASE_TABLET | Freq: Every day | ORAL | Status: DC
  Administered 2020-12-09 – 2020-12-11 (×3): 40 mg via ORAL
  Filled 2020-12-09 (×3): qty 1

## 2020-12-09 NOTE — Op Note (Signed)
NAME: Alex Sims, Alex Sims MEDICAL RECORD NO: [id number] ACCOUNT NO: [id number] DATE OF BIRTH: 04-15-1975 FACILITY: [facility] LOCATION: [facility] PHYSICIAN: Revonda Standard. 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  PROCEDURE PERFORMED:   Xi robotic-assisted left thoracoscopy,  Left upper lobectomy, Lymph node dissection and Intercostal nerve blocks levels 3 through 10.  SURGEON:  Revonda Standard. Roxan Hockey, MD   ASSISTANT:  Enid Cutter, PA  ANESTHESIA:  General.  FINDINGS:  Nodule in the superior portion of the left lower lobe.  Some visceral pleural invagination, no abnormality of the parietal pleura.  Bronchial margin free of tumor on frozen section.  CLINICAL NOTE:  Alex Sims is a 45 year old man who presented with atypical chest pain and cough.  CT of the chest showed a 2.6 x 2.2 cm left upper lobe lesion.  On followup, the nodule had increased in size to 3.1 cm and there was a question of some  small satellite nodules.  Dr. Lamonte Sakai did a navigational bronchoscopy, which showed adenocarcinoma.  He was advised to undergo surgical resection.  The indications, risks, benefits, and alternatives were discussed in detail with the patient.  He understood  and accepted the risks and agreed to proceed   OPERATIVE NOTE:  Alex Sims was brought to the preoperative holding area on 12/08/2020.  Anesthesia established intravenous access and placed an arterial blood pressure monitoring line.  He was taken to the operating room and anesthetized and intubated  with a double lumen endotracheal tube.  Intravenous antibiotics were administered.  Sequential compression devices were placed on the calves for DVT prophylaxis.  A Foley catheter was placed.  He was placed in a right lateral decubitus  position and the  left chest was prepped and draped in the usual sterile fashion.  Single lung ventilation of the right lung was initiated and was tolerated well throughout the procedure.  A timeout was performed.  A solution containing 20 mL of liposomal bupivacaine, 30 mL of 0.5% bupivacaine and 50 mL of saline was prepared.  This was used for local at the incision sites as well as for the intercostal nerve blocks. An incision was made  in the eighth interspace in the midaxillary line.  An 8 mm port was inserted.  The thoracoscope was advanced into the chest. After confirming intrapleural placement, carbon dioxide was insufflated per protocol.  A 12 mm port was placed in the eighth  interspace anterior to the camera and then a 12 mm AirSeal port was placed in the tenth interspace anteriorly.  Intercostal nerve blocks then were performed from the third to the tenth interspace.  10 mL of the bupivacaine solution were injected into a  subpleural plane at each level.  Two additional robotic ports were placed in the eighth interspace.  The robot was deployed.  The camera arm was docked.  Targeting was performed.  The remaining arms were docked. The robotic instruments were inserted with thoracoscopic visualization.  Lower lobe was retracted superiorly.  The inferior ligament was divided with bipolar cautery.  Level 8 and level 9 nodes were removed.  The lower lobe then was retracted anteriorly and the pleural reflection was divided to the hilum posteriorly.  Small  level 7 nodes were removed and then a larger level 7 node was also removed.  All lymph  nodes that were removed were sent for permanent pathology.  The posterior aspect of the pulmonary artery was dissected out and dissection was begun on a level 12 node  next to an upper lobe branch.  The dissection continued superiorly and then aortopulmonary window was explored and nodes were removed and sent for permanent pathology.  There was some bleeding  with the removal of the AP window nodes and Surgicel was  packed into that area.  The lung then was retracted posteriorly and the pleural reflection was divided anteriorly.  Next, the fissure was explored.  The fissure was incomplete.  With dissection, the PA was identified.  It was unclear which branch was being visualized. It turned out to be the lingular branch as suspected. Working from the hilum, a large level 11 node was identified and was removed.  Division of the fissure then began working from an anterior approach.   Once the anterior portion of the fissure was completed, dissection continued along the pulmonary artery and multiple firings of the robotic stapler were used to complete the fissure working posteriorly. The lingular segmental arterial branch was  dissected out.  It was divided with the vascular stapler and then a second lingular branch also was encircled and divided with a stapler.  Dissection then was done, both from a posterior and anterior approach.  The posterior branch was divided and then  there was a large anterior branch that was divided from an anterior to posterior approach.  The superior pulmonary vein had been divided prior to dividing the lingular arterial branch. Stapler then was placed across the left upper lobe bronchus at its  origin.  It was closed.  A test inflation showed good aeration of the lower lobe.  The stapler was fired transecting the left upper lobe bronchus.  There was some bleeding from the bronchial artery after division of the bronchus which was controlled with  bipolar cautery.  The sponges and vessel loop that had been used during the dissection were removed.  The left upper lobe was placed into an endoscopic retrieval bag and brought down to the inferior portion of the chest.  The robotic instruments were  removed and the robot was undocked.  The anterior eighth interspace incision was lengthened to 3 cm and the left upper lobe was removed in the  endoscopic retrieval bag through this incision and sent for frozen section of the bronchial margin, which  subsequently returned with no tumor seen.  The chest was copiously irrigated with warm saline.  A test inflation to 30 cm water revealed no air leak from the bronchial stump or the staple lines.  A 28-French Blake drain was placed through the original  port incision, was directed to the apex and was secured with a #1 silk suture.  Dual lung ventilation then was resumed.  The remaining incisions were closed in standard fashion.  The chest tube was placed to a Pleur-Evac on waterseal.  The patient was  placed back in a supine position.  He was extubated in the operating room and taken to the postanesthetic care unit in good condition.  All sponge, needle and instrument counts were correct at the end of the procedure.   PAA D: 12/08/2020 5:03:00 pm T: 12/09/2020 3:12:00 am  JOB: [id number]/ [id number]

## 2020-12-09 NOTE — Progress Notes (Addendum)
[address]       [place] [address]             [phone]      1 Day Post-Op Procedure(s) (LRB): XI ROBOTIC ASSISTED THORASCOPY-LEFT UPPER LOBECTOMY (Left) NODE DISSECTION (Left) INTERCOSTAL NERVE BLOCK (Left) Subjective: Some pain, some indigestion  Objective: Vital signs in last 24 hours: Temp:  [97.7 F (36.5 C)-98.2 F (36.8 C)] 97.9 F (36.6 C) (10/15 0700) Pulse Rate:  [54-90] 54 (10/15 0700) Cardiac Rhythm: Sinus bradycardia (10/15 0701) Resp:  [11-25] 17 (10/15 0700) BP: (90-136)/(63-87) 90/64 (10/15 0700) SpO2:  [94 %-100 %] 94 % (10/15 0700) Arterial Line BP: (115-129)/(52-61) 115/56 (10/14 1715) Weight:  [59 kg] 59 kg (10/14 0958)  Hemodynamic parameters for last 24 hours:    Intake/Output from previous day: 10/14 0701 - 10/15 0700 In: 3692.7 [I.V.:3592.7; IV Piggyback:100] Out: 9147 [Urine:1475; Blood:100; Chest Tube:191] Intake/Output this shift: Total I/O In: -  Out: 350 [Urine:350]  General appearance: alert, cooperative, and no distress Heart: regular rate and rhythm Lungs: clear to auscultation bilaterally Abdomen: benign Extremities: no edema or calf tenderness Wound: incis/dressings ok  Lab Results: Recent Labs    12/06/20 1335 12/09/20 0357  WBC 5.1 9.8  HGB 15.1 12.2*  HCT 44.0 35.7*  PLT 225 182   BMET:  Recent Labs    12/06/20 1335 12/09/20 0357  NA 136 132*  K 4.1 4.1  CL 105 102  CO2 22 25  GLUCOSE 89 187*  BUN 17 9  CREATININE 0.71 0.87  CALCIUM 9.2 7.9*    PT/INR:  Recent Labs    12/06/20 1335  LABPROT 14.3  INR 1.1   ABG    Component Value Date/Time   PHART 7.397 12/06/2020 1400   HCO3 24.1 12/06/2020 1400   ACIDBASEDEF 0.1 12/06/2020 1400   O2SAT 98.3 12/06/2020 1400   CBG (last 3)  No results for input(s): GLUCAP in the last 72 hours.  Meds Scheduled Meds:  acetaminophen  1,000 mg Oral Q6H   Or   acetaminophen (TYLENOL) oral liquid 160 mg/5 mL  1,000 mg Oral Q6H    bisacodyl  10 mg Oral Daily   Chlorhexidine Gluconate Cloth  6 each Topical Daily   enoxaparin (LOVENOX) injection  40 mg Subcutaneous Daily   ketorolac  30 mg Intravenous Q6H   senna-docusate  1 tablet Oral QHS   Continuous Infusions:  sodium chloride 75 mL/hr at 12/09/20 0242   PRN Meds:.morphine injection, ondansetron (ZOFRAN) IV, oxyCODONE  Xrays DG Chest Port 1 View  Result Date: 12/08/2020 CLINICAL DATA:  Status post left upper lobectomy EXAM: PORTABLE CHEST 1 VIEW COMPARISON:  12/06/2020 FINDINGS: Patient is status post left upper lobectomy. Left large bore chest tube is noted in place. Incomplete re-expansion of the left lung is noted with a moderate size pneumothorax. Right jugular central line is noted in satisfactory position. Right lung is clear. No bony abnormality is noted. IMPRESSION: Status post left upper lobectomy with incomplete re-expansion of the left lung. Chest tube is noted in place. Electronically Signed   By: Inez Catalina M.D.   On: 12/08/2020 19:18    Assessment/Plan: S/P Procedure(s) (LRB): XI ROBOTIC ASSISTED THORASCOPY-LEFT UPPER LOBECTOMY (Left) NODE DISSECTION (Left) INTERCOSTAL NERVE BLOCK (Left) POD#1 1 afeb, SBP 90-130 range, sinus brady 2 sats ok on RA 3 good UOP 4  CT 191 cc, 166 in last 12 h, 1/7 air leak- keep on H2O seal for now 5 CXR  small left apical pntx/space 6 normal renal fxn 7 minor expected ABLA 8 minor hyponatremia 9 add protonix for acid reflux c/o    LOS: 1 day    John Giovanni PA-C Pager [phone] 12/09/2020  Agree with above Pneumothorax improved Will keep tube for now  Lajuana Matte

## 2020-12-10 ENCOUNTER — Inpatient Hospital Stay (HOSPITAL_COMMUNITY): Payer: Commercial Managed Care - PPO

## 2020-12-10 LAB — CBC
HCT: 35.4 % — ABNORMAL LOW (ref 39.0–52.0)
Hemoglobin: 12 g/dL — ABNORMAL LOW (ref 13.0–17.0)
MCH: 32.7 pg (ref 26.0–34.0)
MCHC: 33.9 g/dL (ref 30.0–36.0)
MCV: 96.5 fL (ref 80.0–100.0)
Platelets: 146 10*3/uL — ABNORMAL LOW (ref 150–400)
RBC: 3.67 MIL/uL — ABNORMAL LOW (ref 4.22–5.81)
RDW: 13 % (ref 11.5–15.5)
WBC: 6.2 10*3/uL (ref 4.0–10.5)
nRBC: 0 % (ref 0.0–0.2)

## 2020-12-10 LAB — COMPREHENSIVE METABOLIC PANEL
ALT: 14 U/L (ref 0–44)
AST: 32 U/L (ref 15–41)
Albumin: 2.5 g/dL — ABNORMAL LOW (ref 3.5–5.0)
Alkaline Phosphatase: 31 U/L — ABNORMAL LOW (ref 38–126)
Anion gap: 5 (ref 5–15)
BUN: 7 mg/dL (ref 6–20)
CO2: 23 mmol/L (ref 22–32)
Calcium: 7.8 mg/dL — ABNORMAL LOW (ref 8.9–10.3)
Chloride: 110 mmol/L (ref 98–111)
Creatinine, Ser: 0.8 mg/dL (ref 0.61–1.24)
GFR, Estimated: >60 mL/min (ref >60)
Glucose, Bld: 109 mg/dL — ABNORMAL HIGH (ref 70–99)
Potassium: 3.7 mmol/L (ref 3.5–5.1)
Sodium: 138 mmol/L (ref 135–145)
Total Bilirubin: 0.7 mg/dL (ref 0.3–1.2)
Total Protein: 4.9 g/dL — ABNORMAL LOW (ref 6.5–8.1)

## 2020-12-10 NOTE — Discharge Summary (Addendum)
Physician Discharge Summary  Patient ID: Alex Sims MRN: [id number] DOB/AGE: 04-13-1975 45 y.o. PCP is Pcp, No Referring Provider is Curt Bears, MD Admit date: 12/08/2020 Discharge date: 12/11/2020  Admission Diagnoses:  Adenocarcinoma of left upper lobe- Clinical stage IA or IB (T1 or 2, N0)  Discharge Diagnoses:   Adenocarcinoma of left upper lobe- Pathologic stage IB(T2aN0) S/P lobectomy of lung   Patient Active Problem List   Diagnosis Date Noted   S/P lobectomy of lung 12/08/2020   Adenocarcinoma of left lung, stage 1 ([place]) 10/11/2020   Pulmonary nodule 1 cm or greater in diameter 08/25/2020   Heavy alcohol use 09/15/2012   Poor weight gain in adult 09/15/2012    History of Present Illness  Alex Sims is a 45 year old man with a history of COVID, ethanol use and minimal tobacco use.  Back in December he had chest discomfort and coughing.  He was diagnosed with COVID.  He had recurrent symptoms in June.  He describes left parasternal pain.  Not exertional in nature.  He tested positive for COVID again.  A CT of the chest was done on June 19 to evaluate his chest discomfort.  There was a 2.6 x 2.2 cm left upper lobe lesion.  He was treated with Paxlovid, but stopped it due to side effects.  He saw Dr. Lamonte Sakai.  A follow-up CT showed a 3.1 cm left upper lobe nodule abutting the pleural surface with small satellite nodules.  Navigational bronchoscopy on 10/02/2020 showed adenocarcinoma.  He saw Dr. Julien Nordmann.  A PET/CT showed no evidence of regional or distant metastatic disease.  MRI of the brain showed no evidence of metastasis.  He says he smokes 1 to 2 cigarettes a week.  He stopped completely in June.  He drinks about a pint of alcohol a week.  He is not having any anginal type chest pain, pressure, or tightness.  He is not having any issues with shortness of breath.  He has lost about 6 pounds over the past 3 months.  He attributes that mainly to poor appetite  due to anxiety.  He lives with his 1-year-old daughter.  He does have some family help close by.  He works as an Agricultural consultant at [employer].  Left upper lobectomy was offered Alex Sims by Dr. Roxan Hockey and the patient decided to proceed.  Course in Hospital:  Alex Sims was admitted for elective surgery on 12/08/20 and taken to the OR where robotic-assisted left upper lobectomy was performed without complication. Intra-operative frozen section on the bronchial margin was negative for malignancy. Following the procedure, he was taken to the PACU in stable condition.   Postoperative hospital course:  The patient has done well.  He has maintained stable hemodynamics.  He does have a sinus bradycardia.  He does have an expected acute blood loss anemia which is stable.  Chest tube drainage has slowed over the first 2 days.  He has a small apical pneumothorax/space.  The chest tube was clamped on postoperative #2 and follow up CXR showed no change in size of pneumothorax/space.  The chest tube was removed and follow up CXR was satisfactory.  He is maintaining normal renal function and good urine output.  He is tolerating gradually increasing activities using standard rehabilitation protocols.  Oxygen was weaned and he maintains good saturations on room air.  Discharged Condition: stable   Consults: None  Significant Diagnostic Studies:  DG Chest 1 View  Result Date: 12/10/2020 CLINICAL DATA:  Follow-up status post  chest tube clamping. EXAM: CHEST  1 VIEW COMPARISON:  Chest x-ray from earlier same day. FINDINGS: LEFT-sided chest tube is stable in position. Persistent small pneumothorax at the LEFT lung apex, slightly decreased in size compared to today's earlier chest x-ray. No new lung findings. Heart size and mediastinal contours are stable. IMPRESSION: Persistent small pneumothorax at the LEFT lung apex, slightly decreased in size compared to today's earlier chest x-ray. Electronically Signed    By: Franki Cabot M.D.   On: 12/10/2020 13:07   DG Chest 1 View  Result Date: 12/10/2020 CLINICAL DATA:  Status post LEFT upper lobectomy on 12/08/2020 EXAM: CHEST  1 VIEW COMPARISON:  Chest x-ray dated 12/09/2020. FINDINGS: LEFT-sided chest tube is stable in position. Small to moderate pneumothorax at the LEFT lung apex, slightly decreased in size compared to yesterday's exam. Probable mild atelectasis at the LEFT lung base. RIGHT lung is clear. LEFT-sided chest tube is stable in position with tip directed towards the medial aspect of the LEFT upper lobe. RIGHT IJ central line is stable in position. IMPRESSION: 1. Small to moderate pneumothorax at the LEFT lung apex, slightly decreased in size compared to yesterday's exam. LEFT-sided chest tube is stable in position. 2. Probable mild atelectasis at the LEFT lung base. 3. RIGHT lung is clear. Electronically Signed   By: Franki Cabot M.D.   On: 12/10/2020 08:11   DG Chest 2 View  Result Date: 12/11/2020 CLINICAL DATA:  Follow-up exam left pneumothorax EXAM: CHEST - 2 VIEW COMPARISON:  the previous day's study FINDINGS: Small left apical pneumothorax, slightly decreased since previous, apex projecting just below the posterior aspect left second rib. Lungs remain clear. Heart size and mediastinal contours are within normal limits. Small bilateral pleural effusions. Right IJ central line to the proximal right atrium. Visualized bones unremarkable. IMPRESSION: 1. Slight decrease in size of small left apical pneumothorax. Electronically Signed   By: Lucrezia Europe M.D.   On: 12/11/2020 06:56   DG Chest 2 View  Result Date: 12/07/2020 CLINICAL DATA:  Left lung adenocarcinoma. EXAM: CHEST - 2 VIEW COMPARISON:  10/02/2020 FINDINGS: Irregular mass measuring approximately 3 cm is again seen in the left upper lobe, consistent with bronchogenic carcinoma. The lungs are otherwise clear. No evidence of pleural effusion. Heart size is normal. No evidence of hilar or  mediastinal lymphadenopathy. IMPRESSION: Approximately 3 cm left upper lobe mass, consistent with bronchogenic carcinoma. Electronically Signed   By: Marlaine Hind M.D.   On: 12/07/2020 08:43   DG Chest Port 1 View  Result Date: 12/09/2020 CLINICAL DATA:  Follow-up of pneumothorax after left upper lobectomy. EXAM: PORTABLE CHEST 1 VIEW COMPARISON:  Yesterday FINDINGS: Hyperinflation. Right internal jugular line tip at mid right atrium. Midline trachea. Normal heart size. Left chest tube is similar in position. Left-sided pneumothorax is decreased. The visceral pleural line is difficult to visualize at the apex, but identified 1.0 cm from the chest wall laterally. Decreased from 1.6 cm on the prior exam. Estimated at 20. There is likely also a medial component. No lobar consolidation. IMPRESSION: Left chest tube remaining in place with decrease in left-sided pneumothorax. Underlying hyperinflation, consistent with COPD. Emphysema (ICD10-J43.9). Electronically Signed   By: Abigail Miyamoto M.D.   On: 12/09/2020 08:42   DG Chest Port 1 View  Result Date: 12/08/2020 CLINICAL DATA:  Status post left upper lobectomy EXAM: PORTABLE CHEST 1 VIEW COMPARISON:  12/06/2020 FINDINGS: Patient is status post left upper lobectomy. Left large bore chest tube is noted in  place. Incomplete re-expansion of the left lung is noted with a moderate size pneumothorax. Right jugular central line is noted in satisfactory position. Right lung is clear. No bony abnormality is noted. IMPRESSION: Status post left upper lobectomy with incomplete re-expansion of the left lung. Chest tube is noted in place. Electronically Signed   By: Inez Catalina M.D.   On: 12/08/2020 19:18     Treatments: surgery:  Operative Report    DATE OF PROCEDURE: 12/08/2020   PREOPERATIVE DIAGNOSIS:  Left upper lobe adenocarcinoma, clinical stage IA or B ( or T2, N0).   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   PROCEDURE PERFORMED:  Xi robotic-assisted left thoracoscopy, left upper lobectomy, lymph node dissection and intercostal nerve blocks levels 3 through 10.   SURGEON:  Revonda Standard. Roxan Hockey, MD    ASSISTANT:  Enid Cutter, PA   ANESTHESIA:  General.   FINDINGS:  Nodule in the superior portion of the left lower lobe.  Some visceral pleural invagination, no abnormality of the parietal pleura.  Bronchial margin free of tumor on frozen section.   Discharge Exam: Blood pressure 121/73, pulse 63, temperature 98 F (36.7 C), temperature source Oral, resp. rate 19, height 5\' 10"  (1.778 m), weight 59 kg, SpO2 99 %.  General appearance: alert and no distress Neurologic: intact Heart: SR / SB. Lungs: clear to auscultation bilaterally Wound: The left chest port incisions and CT exit site are intact and dry.   Disposition: Discharged to home in stable condition.    Allergies as of 12/11/2020   No Known Allergies      Medication List     TAKE these medications    albuterol 108 (90 Base) MCG/ACT inhaler Commonly known as: VENTOLIN HFA Inhale 2 puffs into the lungs every 6 (six) hours as needed for wheezing or shortness of breath.   oxyCODONE 5 MG immediate release tablet Commonly known as: Oxy IR/ROXICODONE Take 1 tablet (5 mg total) by mouth every 4 (four) hours as needed for up to 5 days for moderate pain.        Follow-up Information     Melrose Nakayama, MD. Go on 12/26/2020.   Specialty: Cardiothoracic Surgery Why: Your appointment is at 10:45am. Please arrive 30 minutes early for a chest x-ray to be performed by [facility] located on the first floor of the same building. Contact information: [address] [place] [address] [phone]                 Signed: Antony Odea, PA-C 12/11/2020, 9:28 AM

## 2020-12-10 NOTE — Plan of Care (Signed)
  Problem: Education: Goal: Knowledge of General Education information will improve Description: Including pain rating scale, medication(s)/side effects and non-pharmacologic comfort measures Outcome: Progressing   Problem: Clinical Measurements: Goal: Ability to maintain clinical measurements within normal limits will improve Outcome: Progressing Goal: Will remain free from infection Outcome: Progressing   Problem: Activity: Goal: Risk for activity intolerance will decrease Outcome: Progressing   Problem: Coping: Goal: Level of anxiety will decrease Outcome: Progressing   Problem: Elimination: Goal: Will not experience complications related to bowel motility Outcome: Progressing Goal: Will not experience complications related to urinary retention Outcome: Progressing   Problem: Pain Managment: Goal: General experience of comfort will improve Outcome: Progressing   Problem: Safety: Goal: Ability to remain free from injury will improve Outcome: Progressing   Problem: Skin Integrity: Goal: Risk for impaired skin integrity will decrease Outcome: Progressing   Problem: Education: Goal: Knowledge of disease or condition will improve Outcome: Progressing Goal: Knowledge of the prescribed therapeutic regimen will improve Outcome: Progressing   Problem: Activity: Goal: Risk for activity intolerance will decrease Outcome: Progressing   Problem: Cardiac: Goal: Will achieve and/or maintain hemodynamic stability Outcome: Progressing   Problem: Clinical Measurements: Goal: Postoperative complications will be avoided or minimized Outcome: Progressing   Problem: Respiratory: Goal: Respiratory status will improve Outcome: Progressing   Problem: Pain Management: Goal: Pain level will decrease Outcome: Progressing   Problem: Skin Integrity: Goal: Wound healing without signs and symptoms infection will improve Outcome: Progressing

## 2020-12-10 NOTE — Plan of Care (Signed)
  Problem: Education: Goal: Knowledge of General Education information will improve Description: Including pain rating scale, medication(s)/side effects and non-pharmacologic comfort measures Outcome: Progressing   Problem: Health Behavior/Discharge Planning: Goal: Ability to manage health-related needs will improve Outcome: Progressing   Problem: Clinical Measurements: Goal: Ability to maintain clinical measurements within normal limits will improve Outcome: Progressing Goal: Will remain free from infection Outcome: Progressing Goal: Respiratory complications will improve Outcome: Progressing Goal: Cardiovascular complication will be avoided Outcome: Progressing   Problem: Activity: Goal: Risk for activity intolerance will decrease Outcome: Progressing   Problem: Nutrition: Goal: Adequate nutrition will be maintained Outcome: Progressing   Problem: Coping: Goal: Level of anxiety will decrease Outcome: Progressing   Problem: Elimination: Goal: Will not experience complications related to bowel motility Outcome: Progressing Goal: Will not experience complications related to urinary retention Outcome: Progressing   Problem: Pain Managment: Goal: General experience of comfort will improve Outcome: Progressing   Problem: Safety: Goal: Ability to remain free from injury will improve Outcome: Progressing   Problem: Skin Integrity: Goal: Risk for impaired skin integrity will decrease Outcome: Progressing   Problem: Education: Goal: Knowledge of disease or condition will improve Outcome: Progressing Goal: Knowledge of the prescribed therapeutic regimen will improve Outcome: Progressing   Problem: Activity: Goal: Risk for activity intolerance will decrease Outcome: Progressing   Problem: Cardiac: Goal: Will achieve and/or maintain hemodynamic stability Outcome: Progressing   Problem: Clinical Measurements: Goal: Postoperative complications will be avoided or  minimized Outcome: Progressing   Problem: Respiratory: Goal: Respiratory status will improve Outcome: Progressing   Problem: Pain Management: Goal: Pain level will decrease Outcome: Progressing   Problem: Skin Integrity: Goal: Wound healing without signs and symptoms infection will improve Outcome: Progressing

## 2020-12-10 NOTE — Progress Notes (Addendum)
[address]       [place] [address]             [phone]      2 Days Post-Op Procedure(s) (LRB): XI ROBOTIC ASSISTED THORASCOPY-LEFT UPPER LOBECTOMY (Left) NODE DISSECTION (Left) INTERCOSTAL NERVE BLOCK (Left) Subjective: Feels ok, some discomfort from the tube  Objective: Vital signs in last 24 hours: Temp:  [97.6 F (36.4 C)-98 F (36.7 C)] 97.8 F (36.6 C) (10/16 0712) Pulse Rate:  [54-63] 56 (10/16 0712) Cardiac Rhythm: Sinus bradycardia (10/15 1900) Resp:  [16-23] 19 (10/16 0712) BP: (94-113)/(64-83) 103/72 (10/16 0712) SpO2:  [96 %-99 %] 97 % (10/16 0712)  Hemodynamic parameters for last 24 hours:    Intake/Output from previous day: 10/15 0701 - 10/16 0700 In: 2600 [P.O.:900; I.V.:900] Out: 1000 [Urine:800; Chest Tube:200] Intake/Output this shift: No intake/output data recorded.  General appearance: alert, cooperative, and no distress Heart: regular rate and rhythm Lungs: clear to auscultation bilaterally Abdomen: benign Extremities: no edema or calf tenderness Wound: incis healing well  Lab Results: Recent Labs    12/09/20 0357 12/10/20 0336  WBC 9.8 6.2  HGB 12.2* 12.0*  HCT 35.7* 35.4*  PLT 182 146*   BMET:  Recent Labs    12/09/20 0357 12/10/20 0336  NA 132* 138  K 4.1 3.7  CL 102 110  CO2 25 23  GLUCOSE 187* 109*  BUN 9 7  CREATININE 0.87 0.80  CALCIUM 7.9* 7.8*    PT/INR: No results for input(s): LABPROT, INR in the last 72 hours. ABG    Component Value Date/Time   PHART 7.397 12/06/2020 1400   HCO3 24.1 12/06/2020 1400   ACIDBASEDEF 0.1 12/06/2020 1400   O2SAT 98.3 12/06/2020 1400   CBG (last 3)  No results for input(s): GLUCAP in the last 72 hours.  Meds Scheduled Meds:  acetaminophen  1,000 mg Oral Q6H   Or   acetaminophen (TYLENOL) oral liquid 160 mg/5 mL  1,000 mg Oral Q6H   bisacodyl  10 mg Oral Daily   Chlorhexidine Gluconate Cloth  6 each Topical Daily   enoxaparin (LOVENOX)  injection  40 mg Subcutaneous Daily   ketorolac  30 mg Intravenous Q6H   pantoprazole  40 mg Oral Daily   senna-docusate  1 tablet Oral QHS   Continuous Infusions:  sodium chloride 75 mL/hr at 12/10/20 0349   PRN Meds:.morphine injection, ondansetron (ZOFRAN) IV, oxyCODONE  Xrays DG Chest Port 1 View  Result Date: 12/09/2020 CLINICAL DATA:  Follow-up of pneumothorax after left upper lobectomy. EXAM: PORTABLE CHEST 1 VIEW COMPARISON:  Yesterday FINDINGS: Hyperinflation. Right internal jugular line tip at mid right atrium. Midline trachea. Normal heart size. Left chest tube is similar in position. Left-sided pneumothorax is decreased. The visceral pleural line is difficult to visualize at the apex, but identified 1.0 cm from the chest wall laterally. Decreased from 1.6 cm on the prior exam. Estimated at 20. There is likely also a medial component. No lobar consolidation. IMPRESSION: Left chest tube remaining in place with decrease in left-sided pneumothorax. Underlying hyperinflation, consistent with COPD. Emphysema (ICD10-J43.9). Electronically Signed   By: Abigail Miyamoto M.D.   On: 12/09/2020 08:42   DG Chest Port 1 View  Result Date: 12/08/2020 CLINICAL DATA:  Status post left upper lobectomy EXAM: PORTABLE CHEST 1 VIEW COMPARISON:  12/06/2020 FINDINGS: Patient is status post left upper lobectomy. Left large bore chest tube is noted in place. Incomplete re-expansion of the left lung  is noted with a moderate size pneumothorax. Right jugular central line is noted in satisfactory position. Right lung is clear. No bony abnormality is noted. IMPRESSION: Status post left upper lobectomy with incomplete re-expansion of the left lung. Chest tube is noted in place. Electronically Signed   By: Inez Catalina M.D.   On: 12/08/2020 19:18    Assessment/Plan: S/P Procedure(s) (LRB): XI ROBOTIC ASSISTED THORASCOPY-LEFT UPPER LOBECTOMY (Left) NODE DISSECTION (Left) INTERCOSTAL NERVE BLOCK (Left)  POD#2 1  afeb, VSS, sinus brady in 50's mostly 2 sats good on RA 3 adeq UOP, not all measured 4 CT 200/ 24 h. No air leak, will do a clamp trial ( has PNTX/Space) and poss remove later today 5 normal renal fxn 6 anemia is stable, no leukocytosis 7 minor thrombocytopenia , monitor clinically, on lovenox 8 BS adeq control- not a diabetic 9 CXR - pntx/space is stable, prob <10% 10 lovenox for DVT PPX 11 hasn't walked much ( only in room),  increase activity, cont pulm toilet     LOS: 2 days    John Giovanni PA-C Pager [phone] 12/10/2020   Residual space on chest x-ray, but no air leak. Clamping trial today. If stable will remove chest tube. Home likely tomorrow.  Latasha Puskas Bary Leriche

## 2020-12-11 ENCOUNTER — Encounter (HOSPITAL_COMMUNITY): Payer: Self-pay | Admitting: Thoracic Surgery (Cardiothoracic Vascular Surgery)

## 2020-12-11 ENCOUNTER — Inpatient Hospital Stay (HOSPITAL_COMMUNITY): Payer: Commercial Managed Care - PPO

## 2020-12-11 LAB — SURGICAL PATHOLOGY

## 2020-12-11 MED ORDER — OXYCODONE HCL 5 MG PO TABS: 5.0000 mg | ORAL_TABLET | ORAL | 0 refills | Status: AC | PRN

## 2020-12-11 NOTE — TOC Progression Note (Signed)
Transition of Care [facility]) - Progression Note    Patient Details  Name: Alex Sims MRN: [id number] Date of Birth: Dec 15, 1975  Transition of [facility]) CM/SW Contact  Zenon Mayo, RN Phone Number: 12/11/2020, 10:57 AM  Clinical Narrative:    NCM spoke with patient, he has no PCP , he states he will locate his own PCP and set up  his follow up apt.  He is for discharge.        Expected Discharge Plan and Services           Expected Discharge Date: 12/11/20                                     Social Determinants of Health (SDOH) Interventions    Readmission Risk Interventions No flowsheet data found.

## 2020-12-11 NOTE — Progress Notes (Addendum)
      [address]       [place] [address]             [phone]      Awake and alert, 3 Days Post-Op Procedure(s) (LRB): XI ROBOTIC ASSISTED THORASCOPY-LEFT UPPER LOBECTOMY (Left) NODE DISSECTION (Left) INTERCOSTAL NERVE BLOCK (Left) Subjective: Awake and alert, finishing breakfast. Says he is still having some soreness with activity, had IV morphine this morning after CXR.   CT removed yesterday. On RA with adequate O2 sats.  BM yesterday.   Objective: Vital signs in last 24 hours: Temp:  [97.7 F (36.5 C)-98.4 F (36.9 C)] 98.4 F (36.9 C) (10/17 0016) Pulse Rate:  [59-73] 62 (10/17 0016) Cardiac Rhythm: Normal sinus rhythm (10/16 1917) Resp:  [18-20] 19 (10/17 0016) BP: (107-115)/(67-86) 113/73 (10/17 0016) SpO2:  [90 %-98 %] 95 % (10/17 0016)    Intake/Output from previous day: 10/16 0701 - 10/17 0700 In: 120 [P.O.:120] Out: -  Intake/Output this shift: No intake/output data recorded.  General appearance: alert and no distress Neurologic: intact Heart: SR / SB. Lungs: clear to auscultation bilaterally Wound: The left chest port incisions and CT exit site are intact and dry.   Lab Results: Recent Labs    12/09/20 0357 12/10/20 0336  WBC 9.8 6.2  HGB 12.2* 12.0*  HCT 35.7* 35.4*  PLT 182 146*   BMET:  Recent Labs    12/09/20 0357 12/10/20 0336  NA 132* 138  K 4.1 3.7  CL 102 110  CO2 25 23  GLUCOSE 187* 109*  BUN 9 7  CREATININE 0.87 0.80  CALCIUM 7.9* 7.8*    PT/INR: No results for input(s): LABPROT, INR in the last 72 hours. ABG    Component Value Date/Time   PHART 7.397 12/06/2020 1400   HCO3 24.1 12/06/2020 1400   ACIDBASEDEF 0.1 12/06/2020 1400   O2SAT 98.3 12/06/2020 1400   CLINICAL DATA:  Follow-up exam left pneumothorax   EXAM: CHEST - 2 VIEW   COMPARISON:  the previous day's study   FINDINGS: Small left apical pneumothorax, slightly decreased since previous, apex projecting just below the posterior  aspect left second rib. Lungs remain clear.   Heart size and mediastinal contours are within normal limits.   Small bilateral pleural effusions. Right IJ central line to the proximal right atrium.   Visualized bones unremarkable.   IMPRESSION: 1. Slight decrease in size of small left apical pneumothorax.     Electronically Signed   By: Lucrezia Europe M.D.   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 Stable respiratory status and stable CXR post CT removal yesterday. Path is pending.  D/C the central line this morning. Encourage ambulation and assure pain is controlled on oral medication and plan for discharge to home later today.   -On Lovenox DVT PPX.    LOS: 3 days    Antony Odea, PA-C [phone] 12/11/2020  Patient seen and examined, agree with above Home later today  Remo Lipps C. Roxan Hockey, MD [facility] [phone]

## 2020-12-11 NOTE — Anesthesia Postprocedure Evaluation (Signed)
Anesthesia Post Note  Patient: [name]  Procedure(s) Performed: XI ROBOTIC ASSISTED THORASCOPY-LEFT UPPER LOBECTOMY (Left: Chest) NODE DISSECTION (Left: Chest) INTERCOSTAL NERVE BLOCK (Left: Chest)     Patient location during evaluation: PACU Anesthesia Type: General Level of consciousness: awake and alert Pain management: pain level controlled Vital Signs Assessment: post-procedure vital signs reviewed and stable Respiratory status: spontaneous breathing, nonlabored ventilation, respiratory function stable and patient connected to nasal cannula oxygen Cardiovascular status: blood pressure returned to baseline and stable Postop Assessment: no apparent nausea or vomiting Anesthetic complications: no   No notable events documented.            Alex Sims

## 2020-12-11 NOTE — Progress Notes (Signed)
All set for discharge home, discharge instructions given to pt. Awaiting ride home.

## 2020-12-12 LAB — TYPE AND SCREEN
ABO/RH(D): O POS
Antibody Screen: NEGATIVE
Unit division: 0
Unit division: 0

## 2020-12-12 LAB — BPAM RBC
Blood Product Expiration Date: 202211032359
Blood Product Expiration Date: 202211032359
ISSUE DATE / TIME: 202210101402
Unit Type and Rh: 5100
Unit Type and Rh: 5100

## 2020-12-13 ENCOUNTER — Telehealth: Payer: Self-pay | Admitting: *Deleted

## 2020-12-13 NOTE — Telephone Encounter (Signed)
Patient, s/p lobectomy with intercostal nerve block 12/08/20, called c/o numbeness to the left of his belly button. Per patient, he noticed the numbness today for the first time. Spoke with T. Harriet Pho, [place] and advised patient this sometimes happens after the nerve block. Patient advised to continue to monitor and let us know if it persists. Patient verbalizes understanding.

## 2020-12-14 ENCOUNTER — Telehealth: Payer: Self-pay

## 2020-12-14 NOTE — Telephone Encounter (Signed)
FMLA/STD form completed and faxed to [employer] @[phone]. Beginning leave 12/08/20 through approx 03/05/2021. Forms mailed to patient's home address.

## 2020-12-20 ENCOUNTER — Other Ambulatory Visit: Payer: Self-pay | Admitting: *Deleted

## 2020-12-20 MED ORDER — TRAMADOL HCL 50 MG PO TABS: 50.0000 mg | ORAL_TABLET | Freq: Four times a day (QID) | ORAL | 0 refills | Status: DC | PRN

## 2020-12-20 NOTE — Progress Notes (Signed)
Patient contacted the office requesting a refill of pain medication. Patient is s/p RATs lobectomy 10/14 by Dr. Roxan Hockey. Per patient, he was discharged home on Oxycodone but ran out earlier this week. Patient has been taking Tylenol for pain but states it isn't helping much. Patient c/o post-op aches at incisions. Per T. Harriet Pho, [place], patient provided prescription for Tramadol. Medication called into patient's preferred pharmacy. Patient aware and verbalizes understanding.

## 2020-12-22 ENCOUNTER — Other Ambulatory Visit: Payer: Self-pay | Admitting: *Deleted

## 2020-12-22 NOTE — Progress Notes (Signed)
The proposed treatment discussed in conference is for discussion purpose only and is not a binding recommendation. The patient was not been physically examined, or presented with their treatment options. Therefore, final treatment plans cannot be decided.  

## 2020-12-25 ENCOUNTER — Other Ambulatory Visit: Payer: Self-pay | Admitting: Thoracic Surgery (Cardiothoracic Vascular Surgery)

## 2020-12-25 DIAGNOSIS — C3492 Malignant neoplasm of unspecified part of left bronchus or lung: Secondary | ICD-10-CM

## 2020-12-26 ENCOUNTER — Encounter: Payer: Self-pay | Admitting: Thoracic Surgery (Cardiothoracic Vascular Surgery)

## 2020-12-26 ENCOUNTER — Ambulatory Visit (INDEPENDENT_AMBULATORY_CARE_PROVIDER_SITE_OTHER): Payer: Self-pay | Admitting: Thoracic Surgery (Cardiothoracic Vascular Surgery)

## 2020-12-26 ENCOUNTER — Ambulatory Visit
Admission: RE | Admit: 2020-12-26 | Discharge: 2020-12-26 | Disposition: A | Discharge status: Home / Self Care | Payer: Commercial Managed Care - PPO | Source: Ambulatory Visit | Attending: Thoracic Surgery (Cardiothoracic Vascular Surgery) | Admitting: Thoracic Surgery (Cardiothoracic Vascular Surgery)

## 2020-12-26 ENCOUNTER — Other Ambulatory Visit: Payer: Self-pay

## 2020-12-26 VITALS — BP 106/72 | HR 86 | Resp 20 | Ht 70.0 in | Wt 130.2 lb

## 2020-12-26 DIAGNOSIS — Z09 Encounter for follow-up examination after completed treatment for conditions other than malignant neoplasm: Secondary | ICD-10-CM

## 2020-12-26 DIAGNOSIS — C3492 Malignant neoplasm of unspecified part of left bronchus or lung: Secondary | ICD-10-CM

## 2020-12-26 NOTE — Progress Notes (Signed)
[address]       [place] [address]             [phone]     HPI: Mr. Brandenberger returns for a scheduled postoperative follow-up visit.  Etienne Mowers is a 45 year old man with a history of tobacco abuse, ethanol use, and COVID.  He presented with a chief complaint of left parasternal pain.  Tested positive for COVID a CT of the chest in June showed a 2.6 x 2.2 cm left upper lobe lesion.  A follow-up CT showed the nodule had increased in size and there were some small satellite nodules.  Navigational bronchoscopy with biopsy showed adenocarcinoma.  PET showed no evidence of regional or distant metastatic disease.  I did a robotic assisted left upper lobectomy and node dissection on 12/09/2020.  He did well postoperatively and went home on day 3.  He still has some incisional pain.  He is taking tramadol once or twice a day for that.  He is also taking Tylenol.  He also complains of some numbness in the left costal margin and left upper quadrant region.  No significant respiratory issues.  History reviewed. No pertinent past medical history.   Current Outpatient Medications  Medication Sig Dispense Refill   albuterol (VENTOLIN HFA) 108 (90 Base) MCG/ACT inhaler Inhale 2 puffs into the lungs every 6 (six) hours as needed for wheezing or shortness of breath.     traMADol (ULTRAM) 50 MG tablet Take 1-2 tablets (50-100 mg total) by mouth every 6 (six) hours as needed. 28 tablet 0   No current facility-administered medications for this visit.    Physical Exam BP 106/72 (BP Location: Left Arm, Patient Position: Sitting, Cuff Size: Normal)   Pulse 86   Resp 20   Ht 5\' 10"  (1.778 m)   Wt 130 lb 3.2 oz (59.1 kg)   SpO2 98% Comment: RA  BMI 18.44 kg/m  45 year old man in no acute distress Alert and oriented x3 with no focal deficits Lungs diminished at left base otherwise clear Cardiac regular rate and rhythm Incisions healing well No peripheral  edema  Diagnostic Tests: CHEST - 2 VIEW   COMPARISON:  December 11, 2020.   FINDINGS: The heart size and mediastinal contours are within normal limits. Right lung is clear. Elevated left hemidiaphragm is now noted which may represent postoperative change, but diaphragmatic paralysis cannot be excluded. The visualized skeletal structures are unremarkable.   IMPRESSION: Elevated left hemidiaphragm is now noted which may represent postoperative change, but diaphragmatic paralysis cannot be excluded.     Electronically Signed   By: Marijo Conception M.D.   On: 12/26/2020 10:28 I personally reviewed the chest x-ray.  Does appear there is some elevation of the posterior aspect of the left hemidiaphragm.  Impression: Kerrigan Glendening is a 45 year old man with a history of light tobacco abuse who was found to have a lung nodule on a CT of the chest done to evaluate chest pain in the setting of a positive COVID test.  That showed a left upper lobe lung nodule.  The nodule increased in size on a follow-up scan.  PET showed the nodules hypermetabolic with no regional or distant metastatic disease.  He underwent a robotic assisted left upper lobectomy and node dissection on 12/08/2020.  He is now about 2 weeks out from surgery.  He has some incisional pain which is to be expected.  Nothing out of the ordinary at this point.  He is using tramadol once or twice a day for that.  There is a question of some elevation of the posterior aspect of the left hemidiaphragm.  This would be pretty unusual finding postsurgically.  Its not particularly unusual to see the whole hemidiaphragm elevated.  There is nothing to do with follow that up for now.  He may begin driving on a limited basis.  Appropriate precautions were discussed.  He needs to plan to be out of work for at least 8 weeks.  I will see him back in a couple of months to check on his progress.  Plan: Follow-up with Dr. Julien Nordmann Return in 2  months with PA lateral chest x-ray  Melrose Nakayama, MD [facility] [phone]

## 2020-12-27 ENCOUNTER — Telehealth: Payer: Self-pay | Admitting: *Deleted

## 2020-12-27 DIAGNOSIS — C3492 Malignant neoplasm of unspecified part of left bronchus or lung: Secondary | ICD-10-CM

## 2020-12-27 NOTE — Telephone Encounter (Signed)
Per Dr. Roxan Hockey, patient needs to seen with Dr. Julien Nordmann. I called patient and scheduled him tot be seen next week with Cassie PA-C and Dr. Julien Nordmann. He verbalized understanding of appt time and place.

## 2021-01-01 NOTE — Progress Notes (Signed)
[place] OFFICE PROGRESS NOTE  Pcp, No No address on file  DIAGNOSIS: Stage Ib (T2 a, N0, M0 )non-small cell lung cancer, adenocarcinoma.  The patient presented with a left upper lobe pulmonary nodule.  He was diagnosed in August 2022.  PRIOR THERAPY: 1) robotic assisted thorascopy left upper lobectomy under the care of Dr. Roxan Hockey on 12/08/2020.    CURRENT THERAPY: Observation   INTERVAL HISTORY: Alex Sims 45 y.o. male returns to the clinic today for a follow-up visit.  The patient was first seen in clinic on 10/11/2020 for a right upper lobe pulmonary nodule in which the pathology was consistent with adenocarcinoma.  Dr. Julien Nordmann arrange for the patient to have a PET scan performed at that time and brain MRI to rule out metastatic disease.  The patient did not have any evidence of adenopathy or distant/metastatic disease.  The patient surgery was delayed by testing positive for COVID.  He saw Dr. Roxan Hockey on 12/08/2020 and underwent a RATs.  The final pathology was consistent with a T2a, N0.  The patient had 13 negative lymph nodes.  There was visceral pleural involvement.  Overall the patient tolerated his surgery well but has some postsurgical discomfort in the left chest/posterior rib cage.  He is prescribed tramadol for which he will take 0-2 x per day if needed.  He denies any fever or night sweats. He notes he feels hot and cold over the weekend. His appetite is up and down. He lost about 3 lbs after surgery. He has some shortness of breath but it is slowly getting better. Denies calf pain, swelling, or tenderness. He had a cough over the weekend. Denies sick contacts. Denies nasal congestion or soret throat at this time. He woke up with a mild sore throat on Saturday which resolved later that day. Denies any hemoptysis.  Denies any nausea, vomiting, diarrhea, or constipation.  Denies any headaches or visual changes.  The patient is here today for evaluation and  for more detailed discussion about his current condition.  MEDICAL HISTORY:No past medical history on file.  ALLERGIES:  has No Known Allergies.  MEDICATIONS:  Current Outpatient Medications  Medication Sig Dispense Refill   albuterol (VENTOLIN HFA) 108 (90 Base) MCG/ACT inhaler Inhale 2 puffs into the lungs every 6 (six) hours as needed for wheezing or shortness of breath.     traMADol (ULTRAM) 50 MG tablet Take 1-2 tablets (50-100 mg total) by mouth every 6 (six) hours as needed. 28 tablet 0   No current facility-administered medications for this visit.    SURGICAL HISTORY:  Past Surgical History:  Procedure Laterality Date   APPENDECTOMY     BRONCHIAL BIOPSY  10/02/2020   Procedure: BRONCHIAL BIOPSIES;  Surgeon: Collene Gobble, MD;  Location: [facility] ENDOSCOPY;  Service: Pulmonary;;   BRONCHIAL BRUSHINGS  10/02/2020   Procedure: BRONCHIAL BRUSHINGS;  Surgeon: Collene Gobble, MD;  Location: [facility] ENDOSCOPY;  Service: Pulmonary;;   BRONCHIAL NEEDLE ASPIRATION BIOPSY  10/02/2020   Procedure: BRONCHIAL NEEDLE ASPIRATION BIOPSIES;  Surgeon: Collene Gobble, MD;  Location: [facility] ENDOSCOPY;  Service: Pulmonary;;   BRONCHIAL WASHINGS  10/02/2020   Procedure: BRONCHIAL WASHINGS;  Surgeon: Collene Gobble, MD;  Location: [facility] ENDOSCOPY;  Service: Pulmonary;;   INTERCOSTAL NERVE BLOCK Left 12/08/2020   Procedure: INTERCOSTAL NERVE BLOCK;  Surgeon: Melrose Nakayama, MD;  Location: [place];  Service: Thoracic;  Laterality: Left;   NODE DISSECTION Left 12/08/2020   Procedure: NODE DISSECTION;  Surgeon: Modesto Charon  C, MD;  Location: [place];  Service: Thoracic;  Laterality: Left;   VIDEO BRONCHOSCOPY WITH ENDOBRONCHIAL NAVIGATION N/A 10/02/2020   Procedure: ROBOTIC BRONCHOSCOPY WITH ENDOBRONCHIAL NAVIGATION;  Surgeon: Collene Gobble, MD;  Location: [place] ENDOSCOPY;  Service: Pulmonary;  Laterality: N/A;   VIDEO BRONCHOSCOPY WITH RADIAL ENDOBRONCHIAL ULTRASOUND  10/02/2020   Procedure: RADIAL ENDOBRONCHIAL  ULTRASOUND;  Surgeon: Collene Gobble, MD;  Location: [facility] ENDOSCOPY;  Service: Pulmonary;;    REVIEW OF SYSTEMS:   Review of Systems  Constitutional: Negative for appetite change, chills, fatigue, fever and unexpected weight change.  HENT: Negative for mouth sores, nosebleeds, sore throat and trouble swallowing.   Eyes: Negative for eye problems and icterus.  Respiratory: Positive for dyspnea on exertion and mild cough. Negative for hemoptysis, shortness of breath and wheezing.   Cardiovascular: Negative for chest pain and leg swelling.  Gastrointestinal: Negative for abdominal pain, constipation, diarrhea, nausea and vomiting.  Genitourinary: Negative for bladder incontinence, difficulty urinating, dysuria, frequency and hematuria.   Musculoskeletal: Negative for back pain, gait problem, neck pain and neck stiffness.  Skin: Negative for itching and rash.  Neurological: Negative for dizziness, extremity weakness, gait problem, headaches, light-headedness and seizures.  Hematological: Negative for adenopathy. Does not bruise/bleed easily.  Psychiatric/Behavioral: Negative for confusion, depression and sleep disturbance. The patient is not nervous/anxious.     PHYSICAL EXAMINATION:  Blood pressure 110/88, pulse 73, temperature 97.9 F (36.6 C), resp. rate 18, weight 129 lb 11.2 oz (58.8 kg), SpO2 99 %.  ECOG PERFORMANCE STATUS: 1  Physical Exam  Constitutional: Oriented to person, place, and time and thin appearing male, and in no distress.  HENT:  Head: Normocephalic and atraumatic.  Mouth/Throat: Oropharynx is clear and moist. No oropharyngeal exudate.  Eyes: Conjunctivae are normal. Right eye exhibits no discharge. Left eye exhibits no discharge. No scleral icterus.  Neck: Normal range of motion. Neck supple.  Cardiovascular: Normal rate, regular rhythm, normal heart sounds and intact distal pulses.   Pulmonary/Chest: Effort normal and breath sounds normal. No respiratory distress.  No wheezes. No rales.  Abdominal: Soft. Bowel sounds are normal. Exhibits no distension and no mass. There is no tenderness.  Musculoskeletal: Normal range of motion. Exhibits no edema.  Lymphadenopathy:    No cervical adenopathy.  Neurological: Alert and oriented to person, place, and time. Exhibits normal muscle tone. Gait normal. Coordination normal.  Skin: Skin is warm and dry. No rash noted. Not diaphoretic. No erythema. No pallor.  Psychiatric: Mood, memory and judgment normal.  Vitals reviewed.  LABORATORY DATA: Lab Results  Component Value Date   WBC 4.9 01/03/2021   HGB 14.8 01/03/2021   HCT 42.5 01/03/2021   MCV 92.0 01/03/2021   PLT 293 01/03/2021      Chemistry      Component Value Date/Time   NA 141 01/03/2021 0928   K 3.9 01/03/2021 0928   CL 105 01/03/2021 0928   CO2 26 01/03/2021 0928   BUN 11 01/03/2021 0928   CREATININE 0.86 01/03/2021 0928      Component Value Date/Time   CALCIUM 8.9 01/03/2021 0928   ALKPHOS 82 01/03/2021 0928   AST 18 01/03/2021 0928   ALT 7 01/03/2021 0928   BILITOT 0.6 01/03/2021 0928       RADIOGRAPHIC STUDIES:  DG Chest 1 View  Result Date: 12/10/2020 CLINICAL DATA:  Follow-up status post chest tube clamping. EXAM: CHEST  1 VIEW COMPARISON:  Chest x-ray from earlier same day. FINDINGS: LEFT-sided chest tube is stable  in position. Persistent small pneumothorax at the LEFT lung apex, slightly decreased in size compared to today's earlier chest x-ray. No new lung findings. Heart size and mediastinal contours are stable. IMPRESSION: Persistent small pneumothorax at the LEFT lung apex, slightly decreased in size compared to today's earlier chest x-ray. Electronically Signed   By: Franki Cabot M.D.   On: 12/10/2020 13:07   DG Chest 1 View  Result Date: 12/10/2020 CLINICAL DATA:  Status post LEFT upper lobectomy on 12/08/2020 EXAM: CHEST  1 VIEW COMPARISON:  Chest x-ray dated 12/09/2020. FINDINGS: LEFT-sided chest tube is stable in  position. Small to moderate pneumothorax at the LEFT lung apex, slightly decreased in size compared to yesterday's exam. Probable mild atelectasis at the LEFT lung base. RIGHT lung is clear. LEFT-sided chest tube is stable in position with tip directed towards the medial aspect of the LEFT upper lobe. RIGHT IJ central line is stable in position. IMPRESSION: 1. Small to moderate pneumothorax at the LEFT lung apex, slightly decreased in size compared to yesterday's exam. LEFT-sided chest tube is stable in position. 2. Probable mild atelectasis at the LEFT lung base. 3. RIGHT lung is clear. Electronically Signed   By: Franki Cabot M.D.   On: 12/10/2020 08:11   DG Chest 2 View  Result Date: 12/26/2020 CLINICAL DATA:  Adenocarcinoma of left lung. EXAM: CHEST - 2 VIEW COMPARISON:  December 11, 2020. FINDINGS: The heart size and mediastinal contours are within normal limits. Right lung is clear. Elevated left hemidiaphragm is now noted which may represent postoperative change, but diaphragmatic paralysis cannot be excluded. The visualized skeletal structures are unremarkable. IMPRESSION: Elevated left hemidiaphragm is now noted which may represent postoperative change, but diaphragmatic paralysis cannot be excluded. Electronically Signed   By: Marijo Conception M.D.   On: 12/26/2020 10:28   DG Chest 2 View  Result Date: 12/11/2020 CLINICAL DATA:  Follow-up exam left pneumothorax EXAM: CHEST - 2 VIEW COMPARISON:  the previous day's study FINDINGS: Small left apical pneumothorax, slightly decreased since previous, apex projecting just below the posterior aspect left second rib. Lungs remain clear. Heart size and mediastinal contours are within normal limits. Small bilateral pleural effusions. Right IJ central line to the proximal right atrium. Visualized bones unremarkable. IMPRESSION: 1. Slight decrease in size of small left apical pneumothorax. Electronically Signed   By: Lucrezia Europe M.D.   On: 12/11/2020 06:56    DG Chest 2 View  Result Date: 12/07/2020 CLINICAL DATA:  Left lung adenocarcinoma. EXAM: CHEST - 2 VIEW COMPARISON:  10/02/2020 FINDINGS: Irregular mass measuring approximately 3 cm is again seen in the left upper lobe, consistent with bronchogenic carcinoma. The lungs are otherwise clear. No evidence of pleural effusion. Heart size is normal. No evidence of hilar or mediastinal lymphadenopathy. IMPRESSION: Approximately 3 cm left upper lobe mass, consistent with bronchogenic carcinoma. Electronically Signed   By: Marlaine Hind M.D.   On: 12/07/2020 08:43   DG Chest Port 1 View  Result Date: 12/09/2020 CLINICAL DATA:  Follow-up of pneumothorax after left upper lobectomy. EXAM: PORTABLE CHEST 1 VIEW COMPARISON:  Yesterday FINDINGS: Hyperinflation. Right internal jugular line tip at mid right atrium. Midline trachea. Normal heart size. Left chest tube is similar in position. Left-sided pneumothorax is decreased. The visceral pleural line is difficult to visualize at the apex, but identified 1.0 cm from the chest wall laterally. Decreased from 1.6 cm on the prior exam. Estimated at 20. There is likely also a medial component. No lobar consolidation.  IMPRESSION: Left chest tube remaining in place with decrease in left-sided pneumothorax. Underlying hyperinflation, consistent with COPD. Emphysema (ICD10-J43.9). Electronically Signed   By: Abigail Miyamoto M.D.   On: 12/09/2020 08:42   DG Chest Port 1 View  Result Date: 12/08/2020 CLINICAL DATA:  Status post left upper lobectomy EXAM: PORTABLE CHEST 1 VIEW COMPARISON:  12/06/2020 FINDINGS: Patient is status post left upper lobectomy. Left large bore chest tube is noted in place. Incomplete re-expansion of the left lung is noted with a moderate size pneumothorax. Right jugular central line is noted in satisfactory position. Right lung is clear. No bony abnormality is noted. IMPRESSION: Status post left upper lobectomy with incomplete re-expansion of the left  lung. Chest tube is noted in place. Electronically Signed   By: Inez Catalina M.D.   On: 12/08/2020 19:18     ASSESSMENT/PLAN:  This is a very pleasant 45 year old Caucasian male diagnosed with stage Ib (T2 a, N0, M0) small cell lung cancer, adenocarcinoma.  The patient presented with a left upper lobe lung mass.  He was diagnosed in August 2022.  The patient is status post left upper lobectomy under the care of Dr. Roxan Hockey which was performed on 12/08/2020.  The patient had 13 negative lymph nodes.  There was visceral pleural involvement.  The patient was seen with Dr. Julien Nordmann today. Dr. Julien Nordmann discussed there is no indication for adjuvant chemotherapy with stage IB. Therefore, recommend he continue on observation with a repeat CT scan of the chest in 6 months.   Per Dr. Julien Nordmann, I will request PDL1 and foundation one testing.   Will continue taking tramadol for postsurgical pain.  The patient was advised to call immediately if he has any concerning symptoms in the interval. The patient voices understanding of current disease status and treatment options and is in agreement with the current care plan. All questions were answered. The patient knows to call the clinic with any problems, questions or concerns. We can certainly see the patient much sooner if necessary      Orders Placed This Encounter  Procedures   CT Chest W Contrast    Standing Status:   Future    Standing Expiration Date:   01/03/2022    Order Specific Question:   If indicated for the ordered procedure, I authorize the administration of contrast media per Radiology protocol    Answer:   Yes    Order Specific Question:   Preferred imaging location?    Answer:   [facility]   CBC with Differential ([place] Only)    Standing Status:   Future    Standing Expiration Date:   01/03/2022   CMP ([place] only)    Standing Status:   Future    Standing Expiration Date:   01/03/2022      Tobe Sos  Nazariah Cadet, PA-C 01/03/21  ADDENDUM: Hematology/Oncology Attending: I had a face-to-face encounter with the patient today.  I reviewed his record, lab and recommended his care plan.  This is a very pleasant 45 years old white male recently diagnosed with a stage Ib (T2 a, N0, M0) non-small cell lung cancer, adenocarcinoma presented with left upper lobe lung mass diagnosed in August 2022 status post left upper lobectomy with lymph node dissection under the care of Dr. Roxan Hockey on December 08, 2020.  The tumor size was 3.4 cm but there was involvement of the visceral pleura. I had a lengthy discussion with the patient today about his current disease stage, prognosis  and treatment options. I explained to the patient that he had a curable surgical resection and there is no benefit for adjuvant systemic chemotherapy or radiation for patient with a stage Ib non-small cell lung cancer if the tumor size is less than 4.0 cm. I also explained to the patient that the current standard of care is observation and close monitoring. Will arrange for the patient to have repeat CT scan of the chest in 6 months for restaging of his disease. We will also consider sending his tissue block for molecular studies in case he has any disease recurrence in the future. The patient is in agreement with the current plan. He will come back for follow-up visit in 6 months. He was advised to call immediately if he has any concerning symptoms in the interval. The total time spent in the appointment was 30 minutes. Disclaimer: This note was dictated with voice recognition software. Similar sounding words can inadvertently be transcribed and may be missed upon review. Eilleen Kempf, MD 01/03/21

## 2021-01-03 ENCOUNTER — Inpatient Hospital Stay: Payer: Commercial Managed Care - PPO | Attending: Internal Medicine | Admitting: Physician Assistant

## 2021-01-03 ENCOUNTER — Inpatient Hospital Stay: Payer: Commercial Managed Care - PPO

## 2021-01-03 ENCOUNTER — Other Ambulatory Visit: Payer: Self-pay

## 2021-01-03 ENCOUNTER — Other Ambulatory Visit: Payer: Self-pay | Admitting: Physician Assistant

## 2021-01-03 ENCOUNTER — Inpatient Hospital Stay (HOSPITAL_BASED_OUTPATIENT_CLINIC_OR_DEPARTMENT_OTHER): Payer: Commercial Managed Care - PPO | Admitting: *Deleted

## 2021-01-03 VITALS — BP 110/88 | HR 73 | Temp 97.9°F | Resp 18 | Wt 129.7 lb

## 2021-01-03 DIAGNOSIS — C3412 Malignant neoplasm of upper lobe, left bronchus or lung: Secondary | ICD-10-CM | POA: Diagnosis present

## 2021-01-03 DIAGNOSIS — G8918 Other acute postprocedural pain: Secondary | ICD-10-CM | POA: Insufficient documentation

## 2021-01-03 DIAGNOSIS — Z9049 Acquired absence of other specified parts of digestive tract: Secondary | ICD-10-CM | POA: Insufficient documentation

## 2021-01-03 DIAGNOSIS — Z902 Acquired absence of lung [part of]: Secondary | ICD-10-CM | POA: Diagnosis not present

## 2021-01-03 DIAGNOSIS — R0609 Other forms of dyspnea: Secondary | ICD-10-CM | POA: Insufficient documentation

## 2021-01-03 DIAGNOSIS — J90 Pleural effusion, not elsewhere classified: Secondary | ICD-10-CM | POA: Diagnosis not present

## 2021-01-03 DIAGNOSIS — J439 Emphysema, unspecified: Secondary | ICD-10-CM | POA: Insufficient documentation

## 2021-01-03 DIAGNOSIS — C3492 Malignant neoplasm of unspecified part of left bronchus or lung: Secondary | ICD-10-CM

## 2021-01-03 DIAGNOSIS — Z79899 Other long term (current) drug therapy: Secondary | ICD-10-CM | POA: Insufficient documentation

## 2021-01-03 LAB — CMP (CANCER CENTER ONLY)
ALT: 7 U/L (ref 0–44)
AST: 18 U/L (ref 15–41)
Albumin: 4 g/dL (ref 3.5–5.0)
Alkaline Phosphatase: 82 U/L (ref 38–126)
Anion gap: 10 (ref 5–15)
BUN: 11 mg/dL (ref 6–20)
CO2: 26 mmol/L (ref 22–32)
Calcium: 8.9 mg/dL (ref 8.9–10.3)
Chloride: 105 mmol/L (ref 98–111)
Creatinine: 0.86 mg/dL (ref 0.61–1.24)
GFR, Estimated: >60 mL/min (ref >60)
Glucose, Bld: 85 mg/dL (ref 70–99)
Potassium: 3.9 mmol/L (ref 3.5–5.1)
Sodium: 141 mmol/L (ref 135–145)
Total Bilirubin: 0.6 mg/dL (ref 0.3–1.2)
Total Protein: 7.5 g/dL (ref 6.5–8.1)

## 2021-01-03 LAB — CBC WITH DIFFERENTIAL (CANCER CENTER ONLY)
Abs Immature Granulocytes: 0.01 10*3/uL (ref 0.00–0.07)
Basophils Absolute: 0.1 10*3/uL (ref 0.0–0.1)
Basophils Relative: 1 %
Eosinophils Absolute: 0.2 10*3/uL (ref 0.0–0.5)
Eosinophils Relative: 4 %
HCT: 42.5 % (ref 39.0–52.0)
Hemoglobin: 14.8 g/dL (ref 13.0–17.0)
Immature Granulocytes: 0 %
Lymphocytes Relative: 23 %
Lymphs Abs: 1.1 10*3/uL (ref 0.7–4.0)
MCH: 32 pg (ref 26.0–34.0)
MCHC: 34.8 g/dL (ref 30.0–36.0)
MCV: 92 fL (ref 80.0–100.0)
Monocytes Absolute: 0.6 10*3/uL (ref 0.1–1.0)
Monocytes Relative: 12 %
Neutro Abs: 3 10*3/uL (ref 1.7–7.7)
Neutrophils Relative %: 60 %
Platelet Count: 293 10*3/uL (ref 150–400)
RBC: 4.62 MIL/uL (ref 4.22–5.81)
RDW: 12.1 % (ref 11.5–15.5)
WBC Count: 4.9 10*3/uL (ref 4.0–10.5)
nRBC: 0 % (ref 0.0–0.2)

## 2021-01-03 NOTE — Progress Notes (Signed)
Oncology Nurse Navigator Documentation  Oncology Nurse Navigator Flowsheets 01/03/2021  Abnormal Finding Date 09/07/2020  Confirmed Diagnosis Date 10/02/2020  Diagnosis Status Confirmed Diagnosis Complete  Planned Course of Treatment Surgery  Phase of Treatment Surgery  Surgery Actual Start Date: 12/08/2020  Navigation Complete Date: 01/03/2021  Post Navigation: Continue to Follow Patient? No  Reason Not Navigating Patient: No Treatment, Observation Only  Navigator Location [facility]  Navigator Encounter Type Clinic/MDC/I spoke with [name] today.  I updated him on follow up plan of care. He verbalized understanding. I asked that he call if needed.   Treatment Initiated Date 12/08/2020  Patient Visit Type MedOnc;Follow-up  Treatment Phase Post-Tx Follow-up  Barriers/Navigation Needs Education  Education Other  Interventions Education;Psycho-Social Support  Acuity Level 2-Minimal Needs (1-2 Barriers Identified)  Education Method Verbal  Time Spent with Patient 15

## 2021-01-10 ENCOUNTER — Encounter (HOSPITAL_COMMUNITY): Payer: Self-pay | Admitting: Internal Medicine

## 2021-01-10 ENCOUNTER — Encounter (HOSPITAL_COMMUNITY): Payer: Self-pay

## 2021-01-11 ENCOUNTER — Telehealth: Payer: Self-pay | Admitting: Emergency Medicine

## 2021-01-11 NOTE — Telephone Encounter (Signed)
LMTCB

## 2021-01-11 NOTE — Telephone Encounter (Signed)
Pt had XI ROBOTIC ASSISTED THORASCOPY-LEFT UPPER LOBECTOMY on 12/08/20. Pt would like to know if it is ok for him to start laying in a tanning bed? Dr. Lamonte Sakai please advise.

## 2021-01-11 NOTE — Telephone Encounter (Signed)
Patient is returning phone call. Patient phone number is [phone].

## 2021-01-12 ENCOUNTER — Telehealth: Payer: Self-pay | Admitting: Internal Medicine

## 2021-01-12 NOTE — Telephone Encounter (Signed)
I have called and LM on VM for the pt to call us back.  

## 2021-01-12 NOTE — Telephone Encounter (Signed)
Called pt back and there was no answer- LMTCB

## 2021-01-12 NOTE — Telephone Encounter (Signed)
Sch per 11/10, inbasket , left msg

## 2021-01-12 NOTE — Telephone Encounter (Signed)
Patient is returning phone call. Patient phone number is [phone].

## 2021-01-12 NOTE — Telephone Encounter (Signed)
It is okay with me for Alex Sims to use a tanning bed, but he probably needs to discuss this with thoracic surgery since they did his case.

## 2021-01-15 NOTE — Telephone Encounter (Signed)
I spoke with the pt and notified of response per Dr Lamonte Sakai  Pt verbalized understanding and nothing further needed

## 2021-01-16 ENCOUNTER — Telehealth: Payer: Self-pay | Admitting: *Deleted

## 2021-01-16 ENCOUNTER — Other Ambulatory Visit: Payer: Self-pay | Admitting: *Deleted

## 2021-01-16 DIAGNOSIS — R52 Pain, unspecified: Secondary | ICD-10-CM

## 2021-01-16 DIAGNOSIS — Z902 Acquired absence of lung [part of]: Secondary | ICD-10-CM

## 2021-01-16 NOTE — Telephone Encounter (Signed)
Patient contacted the office stating he is still experiencing pain s/p RATs lobectomy by Dr. Roxan Hockey 10/14. Patient describes pain as a constant ache that gets worse with activity. Patient states pain runs along left upper abdomen along rib cage to his spine. Patient states he has been taking extra strength pain relief tablets without much relief. Patient denies numbness, burning, or pins and needle pain. Patient is concerned about pain and having to go back to work in a few weeks. Follow up chest xray and appt with PA scheduled for patient. Patient acknowledges receipt.

## 2021-01-17 ENCOUNTER — Encounter (HOSPITAL_COMMUNITY): Payer: Self-pay | Admitting: Internal Medicine

## 2021-01-22 ENCOUNTER — Encounter: Payer: Self-pay | Admitting: *Deleted

## 2021-01-22 ENCOUNTER — Other Ambulatory Visit: Payer: Self-pay

## 2021-01-22 ENCOUNTER — Ambulatory Visit (INDEPENDENT_AMBULATORY_CARE_PROVIDER_SITE_OTHER): Payer: Self-pay | Admitting: Physician Assistant

## 2021-01-22 ENCOUNTER — Ambulatory Visit
Admission: RE | Admit: 2021-01-22 | Discharge: 2021-01-22 | Disposition: A | Discharge status: Home / Self Care | Payer: Commercial Managed Care - PPO | Source: Ambulatory Visit | Attending: Thoracic Surgery (Cardiothoracic Vascular Surgery) | Admitting: Thoracic Surgery (Cardiothoracic Vascular Surgery)

## 2021-01-22 VITALS — BP 107/81 | HR 81 | Resp 20 | Ht 70.0 in | Wt 131.0 lb

## 2021-01-22 DIAGNOSIS — Z902 Acquired absence of lung [part of]: Secondary | ICD-10-CM

## 2021-01-22 DIAGNOSIS — R52 Pain, unspecified: Secondary | ICD-10-CM

## 2021-01-22 MED ORDER — PREGABALIN 25 MG PO CAPS: 25.0000 mg | ORAL_CAPSULE | Freq: Two times a day (BID) | ORAL | 1 refills | Status: DC

## 2021-01-22 NOTE — Patient Instructions (Signed)
Begin taking Lyrica 25 mg twice daily for 1 week then increase that dose to 50 mg by mouth twice daily.  Plan to stay out of work until his follow-up with [place] in 2 weeks

## 2021-01-22 NOTE — Progress Notes (Signed)
      [address]       [place] [address]             [phone]      History of Present Illness: Alex Sims is a 45 year old man with a history of tobacco abuse, ethanol use, and COVID.  He presented with a chief complaint of left parasternal pain and subsequently tested positive for COVID.  A CT of the chest in June of this year showed a 2.6 x 2.2 cm left upper lobe lesion.  A follow-up CT showed the nodule had increased in size and there were some small satellite nodules.  Navigational bronchoscopy with biopsy showed adenocarcinoma.  PET showed no evidence of regional or distant metastatic disease.  Dr. Roxan Hockey did a robotic assisted left upper lobectomy and node dissection on 12/09/2020.  He did well postoperatively and went home on day 3.  He was seen in follow-up 4 weeks ago and overall was doing well but was continuing to have pain and numbness in the left costal margin and left upper quadrant.  He was managing this with tramadol and Tylenol but since he is having tramadol, he does note using ibuprofen.  He contacted our office 1 week ago and reported he was continuing to have pain in the same region and was concerned about going back to work before this resolves since his job requires a lot of heavy lifting.   Physical Exam: Vital signs HR: 81 BP: 107/81  RR: 20 SPO2: 95% on room air  Chest: Breath sounds are clear.  Chest x-ray is reviewed and has no complicating features. Lungs: On exam port incisions are nicely healed with no evidence of complication.  He has tenderness along the entire intercostal space that was used for robotic procedure.  Diagnostic studies: CLINICAL DATA:  Post VATS and lobectomy 12/08/2020, ongoing pain   EXAM: CHEST - 2 VIEW   COMPARISON:  12/26/2020   FINDINGS: Normal heart size, mediastinal contours, and pulmonary vascularity.   Persistent elevation of LEFT diaphragm with slight volume loss in LEFT hemithorax  consistent with prior surgery/lobectomy, surgical clips noted at LEFT hilum.   Remaining lungs clear.   No infiltrate, pleural effusion, or pneumothorax.   Osseous structures unremarkable.   IMPRESSION: Postsurgical changes of LEFT lobectomy.   No acute abnormalities.     Electronically Signed   By: Lavonia Dana M.D.   On: 01/22/2021 15:01   Impression/Plan:  Continued pain involving the entire length of the intercostal space used for the robotic lobectomy.  He describes a constant burning sensation that is worse when he lift anything. The patient has some neuropathic component.  We will try a course of Lyrica 25 mg p.o. twice daily for 1 week then 50 mg p.o. twice daily for 1 month.  I asked him to follow-up with [place] in 2 weeks.  Alex Sims was originally scheduled to return to work in about a week but he is given a note to remain out of work until we see him back for follow-up pain management.

## 2021-02-06 ENCOUNTER — Encounter: Payer: Self-pay | Admitting: Thoracic Surgery (Cardiothoracic Vascular Surgery)

## 2021-02-06 ENCOUNTER — Other Ambulatory Visit: Payer: Self-pay

## 2021-02-06 ENCOUNTER — Ambulatory Visit (INDEPENDENT_AMBULATORY_CARE_PROVIDER_SITE_OTHER): Payer: Self-pay | Admitting: Thoracic Surgery (Cardiothoracic Vascular Surgery)

## 2021-02-06 VITALS — BP 112/82 | HR 82 | Resp 20 | Ht 70.0 in

## 2021-02-06 DIAGNOSIS — C3492 Malignant neoplasm of unspecified part of left bronchus or lung: Secondary | ICD-10-CM

## 2021-02-06 DIAGNOSIS — Z902 Acquired absence of lung [part of]: Secondary | ICD-10-CM

## 2021-02-06 NOTE — Progress Notes (Signed)
° °   °  [address]       [place] [address]             [phone]     HPI: Mr. Alex Sims returns for a scheduled follow-up visit  Alex Sims is a 45 year old gentleman with a history of tobacco abuse.  He was found to have a lung nodule on a CT chest was done to evaluate chest pain in the setting of a positive COVID test.  A follow-up scan the nodule increased in size and it was hypermetabolic on PET/CT.  I did a robotic assisted left upper lobectomy and node dissection on 12/08/2020.  He did well postoperatively.  He saw Dr. Julien Nordmann.  No adjuvant therapy is indicated.  He is feeling well.  He still has some shortness of breath, but that is improving.  Pain is improving.  He remains on Lyrica 50 mg twice daily.  Not having to take any other medications for pain.    Current Outpatient Medications  Medication Sig Dispense Refill   albuterol (VENTOLIN HFA) 108 (90 Base) MCG/ACT inhaler Inhale 2 puffs into the lungs every 6 (six) hours as needed for wheezing or shortness of breath.     pregabalin (LYRICA) 25 MG capsule Take 1 capsule (25 mg total) by mouth 2 (two) times daily. For 1 week then increase the dose to 2 capsules (50 mg total) by mouth 2 times daily. 60 capsule 1   No current facility-administered medications for this visit.    Physical Exam BP 112/82 (BP Location: Left Arm, Patient Position: Sitting)    Pulse 82    Resp 20    Ht 5\' 10"  (1.778 m)    SpO2 96% Comment: RA   BMI 18.42 kg/m  45 year old man in no acute distress Alert and oriented x3 with no focal deficits Lungs diminished at left base but otherwise clear Incisions well-healed  Diagnostic Tests: None  Impression: Alex Sims is a 45 year old man with a history of tobacco abuse who was found to have a lung nodule earlier this year in the setting of a positive COVID test.  The nodule increased in size on a follow-up scan and then was hypermetabolic on PET/CT.  I did a robotic  left upper lobectomy on 12/08/2020.  The nodule turned out to be a stage Ib (T2, N0) adenocarcinoma.  No adjuvant therapy was indicated.  He is now about 2 months out from surgery.  He is doing well.  He is not having much pain.  He is still on Lyrica.  I advised him to stay on his current dose of Lyrica for about another month.  At that point if he wants he can drop it down to 25 mg twice a day for a week or so.  If he still not having much pain he can drop down to 25 mg a day for another week.  I did caution him not to stop the medication abruptly.  His activities are unrestricted.  He feels like he is ready to return to work.  Plan: Return in 2 months with PA and lateral chest x-ray  Alex Nakayama, MD [facility] [phone]

## 2021-02-12 ENCOUNTER — Encounter (HOSPITAL_COMMUNITY): Payer: Self-pay

## 2021-02-27 ENCOUNTER — Ambulatory Visit: Payer: Commercial Managed Care - PPO | Admitting: Thoracic Surgery (Cardiothoracic Vascular Surgery)

## 2021-03-29 ENCOUNTER — Telehealth: Payer: Self-pay

## 2021-03-29 NOTE — Telephone Encounter (Signed)
Patient contacted the office requesting for his follow-up appointment to be moved up. He is s/p RATS LU Lobectomy with Dr. Roxan Hockey 11/2020. He is currently experiencing chest hurting/soreness and he states that he is unable to "catch my breath". Able to move his appointment up to this coming Tues, 2/7 but patient states that he is unable to wait. Advised that he should go to the ED for further evaluation of shortness of breath and chest discomfort. He acknowledged receipt. He states that he wants to keep his appt on 2/14 @ 4:30p as it works better with his work schedule.

## 2021-04-09 ENCOUNTER — Other Ambulatory Visit: Payer: Self-pay | Admitting: Thoracic Surgery (Cardiothoracic Vascular Surgery)

## 2021-04-09 DIAGNOSIS — C3492 Malignant neoplasm of unspecified part of left bronchus or lung: Secondary | ICD-10-CM

## 2021-04-10 ENCOUNTER — Ambulatory Visit
Admission: RE | Admit: 2021-04-10 | Discharge: 2021-04-10 | Disposition: A | Discharge status: Home / Self Care | Payer: Commercial Managed Care - PPO | Source: Ambulatory Visit | Attending: Thoracic Surgery (Cardiothoracic Vascular Surgery) | Admitting: Thoracic Surgery (Cardiothoracic Vascular Surgery)

## 2021-04-10 ENCOUNTER — Other Ambulatory Visit: Payer: Self-pay

## 2021-04-10 ENCOUNTER — Ambulatory Visit (INDEPENDENT_AMBULATORY_CARE_PROVIDER_SITE_OTHER): Payer: Commercial Managed Care - PPO | Admitting: Thoracic Surgery (Cardiothoracic Vascular Surgery)

## 2021-04-10 ENCOUNTER — Encounter: Payer: Self-pay | Admitting: Thoracic Surgery (Cardiothoracic Vascular Surgery)

## 2021-04-10 VITALS — BP 117/77 | HR 81 | Resp 20 | Ht 70.0 in | Wt 124.0 lb

## 2021-04-10 DIAGNOSIS — Z902 Acquired absence of lung [part of]: Secondary | ICD-10-CM | POA: Diagnosis not present

## 2021-04-10 DIAGNOSIS — C3492 Malignant neoplasm of unspecified part of left bronchus or lung: Secondary | ICD-10-CM

## 2021-04-10 MED ORDER — PREGABALIN 25 MG PO CAPS: 25.0000 mg | ORAL_CAPSULE | Freq: Two times a day (BID) | ORAL | 1 refills | Status: DC

## 2021-04-10 NOTE — Progress Notes (Signed)
° °   °  [address]       [place] [address]             [phone]      HPI: Alex Sims returns with concerns about his incisional pain.  Alex Sims is a 46 year old man with a history of tobacco abuse who had a robotic assisted left upper lobectomy on 12/08/2020.  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  He was on Lyrica 50 mg twice daily for that and it essentially resolved.  He ran out of Lyrica (his prescription was only for 25 mg tablets).  Now over time he started getting uncomfortable sensations in the left chest with numbness and tingling and occasional burning sensations.  He also feels like he cannot take a full deep breath.  Past Medical History:  Diagnosis Date   Adenocarcinoma of left lung, stage 1 (HCC)     Current Outpatient Medications  Medication Sig Dispense Refill   albuterol (VENTOLIN HFA) 108 (90 Base) MCG/ACT inhaler Inhale 2 puffs into the lungs every 6 (six) hours as needed for wheezing or shortness of breath.     pregabalin (LYRICA) 25 MG capsule Take 1 capsule (25 mg total) by mouth 2 (two) times daily. 60 capsule 1   No current facility-administered medications for this visit.    Physical Exam BP 117/77    Pulse 81    Resp 20    Ht 5\' 10"  (1.778 m)    Wt 124 lb (56.2 kg)    SpO2 97% Comment: RA   BMI 17.39 kg/m  46 year old man in no acute distress Alert and oriented x3 with no focal deficits Lungs clear with essentially equal breath sounds bilaterally Cardiac regular rate and rhythm Incisions well-healed  Diagnostic Tests: CHEST - 2 VIEW   COMPARISON:  01/22/2021   FINDINGS: Frontal and lateral views of the chest demonstrate a stable cardiac silhouette. Postsurgical changes again noted from partial left pneumonectomy. Minimal volume loss within the left hemithorax, with resolution of the elevated left hemidiaphragm seen previously. No airspace disease, effusion, or pneumothorax. No acute  bony abnormalities.   IMPRESSION: 1. Postsurgical changes as above.  No acute intrathoracic process.     Electronically Signed   By: Randa Ngo M.D.   On: 04/10/2021 16:16  Impression: Alex Sims is a 46 year old man who had a robotic left lower lobectomy back in October 2022.  He had some neuropathic pain which responded well to Lyrica previously.  Unfortunately he ran out of that medication.  Now he is developing similar symptoms with some numbness, tingling, and burning sensation.  We will restart Lyrica at 25 mg twice daily.  He is a little further out from surgery so may be able to get by on the lower dose.  We can always increase it if needed.  Plan: Lyrica 25 mg p.o. twice daily Return in 1 month to check on progress  Melrose Nakayama, MD [facility] [phone]

## 2021-05-08 ENCOUNTER — Ambulatory Visit (INDEPENDENT_AMBULATORY_CARE_PROVIDER_SITE_OTHER): Payer: Commercial Managed Care - PPO | Admitting: Thoracic Surgery (Cardiothoracic Vascular Surgery)

## 2021-05-08 ENCOUNTER — Other Ambulatory Visit: Payer: Self-pay

## 2021-05-08 VITALS — BP 110/72 | HR 76 | Resp 20 | Ht 70.0 in | Wt 125.0 lb

## 2021-05-08 DIAGNOSIS — Z902 Acquired absence of lung [part of]: Secondary | ICD-10-CM | POA: Diagnosis not present

## 2021-05-08 DIAGNOSIS — C3492 Malignant neoplasm of unspecified part of left bronchus or lung: Secondary | ICD-10-CM | POA: Diagnosis not present

## 2021-05-08 NOTE — Progress Notes (Signed)
? ?   ?  [address] ?      [place] [address] ?            [phone]   ? ? ?HPI: Alex Sims returns for follow-up of his postoperative intercostal neuralgia. ? ?Alex Sims is a 46 year old man with a history of tobacco abuse and lung cancer.  He had a robotic assisted left upper lobectomy 03/2020.  He had intercostal neuralgia postoperatively.  He had Lyrica 50 mg twice daily for a while and his pain had nearly resolved.  Unfortunately he ran out of that medication he started having some neuropathic sensations again.  I saw him in February and we started him back on Lyrica 25 mg twice daily.   ? ?He is feeling better.  He still has some discomfort but the Lyrica has made a dramatic difference in the amount of pain he is having.  No issues with shortness of breath. ? ?Past Medical History:  ?Diagnosis Date  ? Adenocarcinoma of left lung, stage 1 ([facility])   ? ? ?Current Outpatient Medications  ?Medication Sig Dispense Refill  ? albuterol (VENTOLIN HFA) 108 (90 Base) MCG/ACT inhaler Inhale 2 puffs into the lungs every 6 (six) hours as needed for wheezing or shortness of breath.    ? pregabalin (LYRICA) 25 MG capsule Take 1 capsule (25 mg total) by mouth 2 (two) times daily. 60 capsule 1  ? ?No current facility-administered medications for this visit.  ? ? ?Physical Exam ?BP 110/72   Pulse 76   Resp 20   Ht 5\' 10"  (1.778 m)   Wt 125 lb (56.7 kg)   SpO2 97% Comment: RA  BMI 17.94 kg/m?  ?Well-appearing 46 year old man in no acute distress ?Alert and oriented x3 ?Lungs slightly diminished at left base, otherwise clear ?Cardiac regular rate and rhythm ? ?Diagnostic Tests: ?None ? ?Impression: ?Alex Sims is a 46 year old man who underwent a robotic left upper lobectomy for a stage Ib adenocarcinoma in October 2022.  He has had some issues with postoperative intercostal neuralgia.  He has had a good response to Lyrica 25 mg twice daily. ? ?He has a CT in May for his 22-month follow-up.   I recommended we continue the 25 mg twice daily through that.  I will see him after that scan is done and we can talk about dropping down to once daily at that time. ? ?Plan: ?Continue Lyrica 25 mg twice daily ?Return in 2 months to check on progress ? ?Melrose Nakayama, MD ?[facility] ?[phone] ? ? ? ? ?

## 2021-07-02 ENCOUNTER — Inpatient Hospital Stay: Payer: Commercial Managed Care - PPO

## 2021-07-04 ENCOUNTER — Ambulatory Visit: Payer: Commercial Managed Care - PPO | Admitting: Internal Medicine

## 2021-07-09 ENCOUNTER — Inpatient Hospital Stay: Payer: Commercial Managed Care - PPO | Attending: Physician Assistant

## 2021-07-09 ENCOUNTER — Other Ambulatory Visit: Payer: Self-pay | Admitting: Thoracic Surgery (Cardiothoracic Vascular Surgery)

## 2021-07-09 DIAGNOSIS — C3490 Malignant neoplasm of unspecified part of unspecified bronchus or lung: Secondary | ICD-10-CM

## 2021-07-10 ENCOUNTER — Encounter: Payer: Commercial Managed Care - PPO | Admitting: Thoracic Surgery (Cardiothoracic Vascular Surgery)

## 2021-07-11 ENCOUNTER — Telehealth: Payer: Self-pay | Admitting: Internal Medicine

## 2021-07-11 NOTE — Telephone Encounter (Signed)
.  Called pt per 5/17 inbasket , Patient was unavailable, a message with appt time and date was left with number on file.   ?

## 2021-07-12 ENCOUNTER — Telehealth: Payer: Self-pay

## 2021-07-12 ENCOUNTER — Inpatient Hospital Stay: Payer: Commercial Managed Care - PPO | Admitting: Internal Medicine

## 2021-07-12 NOTE — Telephone Encounter (Signed)
Patient contacted the office, left voicemail stating that he has been coughing up blood. Patient is s/p RATS Lobectomy with Dr. Roxan Hockey last year. Patient is to follow up in a couple months here. Left voicemail message on patient's phone for return call.

## 2021-08-03 ENCOUNTER — Telehealth: Payer: Self-pay | Admitting: Internal Medicine

## 2021-08-03 NOTE — Telephone Encounter (Signed)
Called patient regarding rescheduled 06/27 appointment due to provider pal, patient has been called and notified.

## 2021-08-17 ENCOUNTER — Ambulatory Visit (HOSPITAL_COMMUNITY)
Admission: RE | Admit: 2021-08-17 | Discharge: 2021-08-17 | Disposition: A | Discharge status: Home / Self Care | Payer: Commercial Managed Care - PPO | Source: Ambulatory Visit | Attending: Physician Assistant | Admitting: Physician Assistant

## 2021-08-17 DIAGNOSIS — C3492 Malignant neoplasm of unspecified part of left bronchus or lung: Secondary | ICD-10-CM

## 2021-08-17 MED ORDER — IOHEXOL 300 MG/ML  SOLN
75.0000 mL | Freq: Once | INTRAMUSCULAR | Status: AC | PRN
  Administered 2021-08-17: 75 mL via INTRAVENOUS

## 2021-08-21 ENCOUNTER — Ambulatory Visit: Payer: Commercial Managed Care - PPO | Admitting: Internal Medicine

## 2021-08-22 ENCOUNTER — Inpatient Hospital Stay: Payer: Commercial Managed Care - PPO | Attending: Physician Assistant | Admitting: Internal Medicine

## 2021-08-23 ENCOUNTER — Ambulatory Visit: Payer: Commercial Managed Care - PPO | Admitting: Internal Medicine

## 2021-09-04 ENCOUNTER — Encounter: Payer: Self-pay | Admitting: *Deleted

## 2021-09-04 ENCOUNTER — Ambulatory Visit (INDEPENDENT_AMBULATORY_CARE_PROVIDER_SITE_OTHER): Payer: Commercial Managed Care - PPO | Admitting: Thoracic Surgery (Cardiothoracic Vascular Surgery)

## 2021-09-04 VITALS — BP 112/71 | HR 85 | Resp 20 | Ht 70.0 in | Wt 124.0 lb

## 2021-09-04 DIAGNOSIS — C3492 Malignant neoplasm of unspecified part of left bronchus or lung: Secondary | ICD-10-CM

## 2021-09-04 DIAGNOSIS — Z902 Acquired absence of lung [part of]: Secondary | ICD-10-CM

## 2021-09-04 NOTE — Progress Notes (Signed)
[address]       [place] [address]             [phone]     HPI: Alex Sims returns for a scheduled follow-up visit  Alex Sims is a 46 year old man with a history of tobacco abuse and lung cancer.  He had a robotic assisted left upper lobectomy in February 2022.  Postoperatively he had intercostal neuralgia and was on Lyrica 50 mg twice daily.  He came off that for a while and started having neuropathic pain again when I saw him in February he started him on Lyrica 25 mg twice daily.  In the interim since that visit he has had improvement in his pain.  He is no longer taking Lyrica.  No significant respiratory problems and has been running up to 2 miles a day.  He quit smoking in August 2022.  He has had a productive cough recently but no fevers or chills.  Past Medical History:  Diagnosis Date   Adenocarcinoma of left lung, stage 1 (HCC)     Current Outpatient Medications  Medication Sig Dispense Refill   albuterol (VENTOLIN HFA) 108 (90 Base) MCG/ACT inhaler Inhale 2 puffs into the lungs every 6 (six) hours as needed for wheezing or shortness of breath.     pregabalin (LYRICA) 25 MG capsule Take 1 capsule (25 mg total) by mouth 2 (two) times daily. 60 capsule 1   No current facility-administered medications for this visit.    Physical Exam BP 112/71 (BP Location: Right Arm, Patient Position: Sitting)   Pulse 85   Resp 20   Ht 5\' 10"  (1.778 m)   Wt 124 lb (56.2 kg)   SpO2 94% Comment: RA  BMI 17.62 kg/m  46 year old man in no acute distress Alert and oriented x3 with no focal deficits No cervical supraclavicular adenopathy Lungs slightly diminished at left base but otherwise clear Cardiac regular rate and rhythm  Diagnostic Tests: CT CHEST WITH CONTRAST   TECHNIQUE: Multidetector CT imaging of the chest was performed during intravenous contrast administration.   RADIATION DOSE REDUCTION: This exam was performed according to  the departmental dose-optimization program which includes automated exposure control, adjustment of the mA and/or kV according to patient size and/or use of iterative reconstruction technique.   CONTRAST:  32mL OMNIPAQUE IOHEXOL 300 MG/ML  SOLN   COMPARISON:  09/05/2020   FINDINGS: Cardiovascular: No significant coronary artery calcification. Global cardiac size within normal limits. No pericardial effusion. Central pulmonary arteries are of normal caliber. The thoracic aorta is unremarkable.   Mediastinum/Nodes: No enlarged mediastinal, hilar, or axillary lymph nodes. Thyroid gland, trachea, and esophagus demonstrate no significant findings.   Lungs/Pleura: Surgical changes of left upper lobectomy are identified. There is nodular centrilobular infiltrate within the dependent left lower lobe likely related to changes of aspiration or acute infection given the associated bronchial wall thickening and airway impaction. Similar changes are seen within the basilar right middle lobe. No pneumothorax or pleural effusion. No central obstructing mass.   Upper Abdomen: No acute abnormality. The adrenal glands are unremarkable.   Musculoskeletal: No acute bone abnormality. No lytic or blastic bone lesion.   IMPRESSION: 1. Status post left upper lobectomy. No evidence of residual or recurrent disease within the thorax. 2. Centrilobular infiltrate within the dependent left lower lobe and right middle lobe with associated bronchial wall thickening and airway impaction, likely related to aspiration or acute infection.     Electronically Signed  By: Fidela Salisbury M.D.   On: 08/20/2021 03:47   I personally reviewed the CT images.  Nonspecific infiltrate in right middle lobe and left lower lobe.  Nothing to suggest recurrent cancer.  Impression: Alex Sims is a 46 year old former smoker who had a thoracoscopic left upper lobectomy in October 2022.  He had a T2a, N0, stage Ib  adenocarcinoma.    Stage Ib adenocarcinoma-status post lobectomy.  Currently under observation.  Recent CT shows no evidence of recurrence.  Needs follow-up with Dr. Julien Nordmann.  Tobacco abuse-quit smoking almost a year ago.  Postoperative intercostal neuralgia-significantly improved.  No longer requiring Lyrica.  Infiltrate on CT-likely some type of atypical infection.  He has had some cough with some congestion but no fevers or chills.  I do not think there is any indication for antibiotics at this point.  If he were to develop fevers, chills, or worsening cough we could reimage and consider empiric antibiotics.  Plan: Follow-up with Dr. Earlie Server I will plan to see him back in about 6 months after his next CT.  Melrose Nakayama, MD [facility] [phone]

## 2021-09-04 NOTE — Progress Notes (Signed)
Per Dr. Roxan Hockey, he would like Dr. Julien Nordmann to see Alex Sims. I completed a scheduling message for patient to be call with an appt.

## 2021-09-05 ENCOUNTER — Telehealth: Payer: Self-pay | Admitting: Internal Medicine

## 2021-09-05 NOTE — Telephone Encounter (Signed)
.  Called patient to schedule appointment per 7/11 inbasket, patient is aware of date and time.   

## 2021-09-25 ENCOUNTER — Inpatient Hospital Stay: Payer: Commercial Managed Care - PPO

## 2021-09-25 ENCOUNTER — Inpatient Hospital Stay: Payer: Commercial Managed Care - PPO | Attending: Physician Assistant | Admitting: Internal Medicine

## 2021-09-25 ENCOUNTER — Encounter: Payer: Self-pay | Admitting: Internal Medicine

## 2021-09-25 ENCOUNTER — Encounter: Payer: Self-pay | Admitting: Medical Oncology

## 2021-09-25 ENCOUNTER — Other Ambulatory Visit: Payer: Self-pay

## 2021-09-25 VITALS — BP 114/84 | HR 82 | Temp 98.1°F | Resp 16 | Wt 121.7 lb

## 2021-09-25 DIAGNOSIS — C3412 Malignant neoplasm of upper lobe, left bronchus or lung: Secondary | ICD-10-CM | POA: Diagnosis present

## 2021-09-25 DIAGNOSIS — R053 Chronic cough: Secondary | ICD-10-CM | POA: Diagnosis not present

## 2021-09-25 DIAGNOSIS — Z79899 Other long term (current) drug therapy: Secondary | ICD-10-CM | POA: Insufficient documentation

## 2021-09-25 DIAGNOSIS — C3492 Malignant neoplasm of unspecified part of left bronchus or lung: Secondary | ICD-10-CM

## 2021-09-25 DIAGNOSIS — Z9049 Acquired absence of other specified parts of digestive tract: Secondary | ICD-10-CM | POA: Insufficient documentation

## 2021-09-25 DIAGNOSIS — C3490 Malignant neoplasm of unspecified part of unspecified bronchus or lung: Secondary | ICD-10-CM

## 2021-09-25 LAB — CMP (CANCER CENTER ONLY)
ALT: 10 U/L (ref 0–44)
AST: 36 U/L (ref 15–41)
Albumin: 4 g/dL (ref 3.5–5.0)
Alkaline Phosphatase: 64 U/L (ref 38–126)
Anion gap: 7 (ref 5–15)
BUN: 12 mg/dL (ref 6–20)
CO2: 26 mmol/L (ref 22–32)
Calcium: 8.7 mg/dL — ABNORMAL LOW (ref 8.9–10.3)
Chloride: 106 mmol/L (ref 98–111)
Creatinine: 0.73 mg/dL (ref 0.61–1.24)
GFR, Estimated: >60 mL/min (ref >60)
Glucose, Bld: 79 mg/dL (ref 70–99)
Potassium: 3.5 mmol/L (ref 3.5–5.1)
Sodium: 139 mmol/L (ref 135–145)
Total Bilirubin: 0.5 mg/dL (ref 0.3–1.2)
Total Protein: 7.3 g/dL (ref 6.5–8.1)

## 2021-09-25 LAB — CBC WITH DIFFERENTIAL (CANCER CENTER ONLY)
Abs Immature Granulocytes: 0.01 10*3/uL (ref 0.00–0.07)
Basophils Absolute: 0.1 10*3/uL (ref 0.0–0.1)
Basophils Relative: 1 %
Eosinophils Absolute: 0.1 10*3/uL (ref 0.0–0.5)
Eosinophils Relative: 2 %
HCT: 42 % (ref 39.0–52.0)
Hemoglobin: 14.7 g/dL (ref 13.0–17.0)
Immature Granulocytes: 0 %
Lymphocytes Relative: 14 %
Lymphs Abs: 0.7 10*3/uL (ref 0.7–4.0)
MCH: 33.7 pg (ref 26.0–34.0)
MCHC: 35 g/dL (ref 30.0–36.0)
MCV: 96.3 fL (ref 80.0–100.0)
Monocytes Absolute: 0.5 10*3/uL (ref 0.1–1.0)
Monocytes Relative: 10 %
Neutro Abs: 3.5 10*3/uL (ref 1.7–7.7)
Neutrophils Relative %: 73 %
Platelet Count: 264 10*3/uL (ref 150–400)
RBC: 4.36 MIL/uL (ref 4.22–5.81)
RDW: 12.7 % (ref 11.5–15.5)
WBC Count: 4.8 10*3/uL (ref 4.0–10.5)
nRBC: 0 % (ref 0.0–0.2)

## 2021-09-25 MED ORDER — DOXYCYCLINE HYCLATE 100 MG PO TABS: 100.0000 mg | ORAL_TABLET | Freq: Two times a day (BID) | ORAL | 0 refills | Status: DC

## 2021-09-25 NOTE — Progress Notes (Signed)
[facility] Telephone:(336) [phone]   Fax:(336) [phone]  OFFICE PROGRESS NOTE  Pcp, No No address on file  DIAGNOSIS: Stage Ib (T2 a, N0, M0 )non-small cell lung cancer, adenocarcinoma.  The patient presented with a left upper lobe pulmonary nodule.  He was diagnosed in August 2022.   PRIOR THERAPY: 1) robotic assisted thorascopy left upper lobectomy under the care of Dr. Roxan Hockey on 12/08/2020.     CURRENT THERAPY: Observation   INTERVAL HISTORY: Alex Sims 46 y.o. male returns to the clinic today for 6 months follow-up visit.  The patient is feeling fine today with no concerning complaints except for new cough that started around 3-4 months ago productive of greenish sputum.  He denied having any chest pain, shortness of breath or hemoptysis.  He has no fever or chills.  He has no nausea, vomiting, diarrhea or constipation.  He has no headache or visual changes.  He has no recent weight loss or night sweats.  He is here today for evaluation with repeat CT scan of the chest for restaging of his disease.  MEDICAL HISTORY: Past Medical History:  Diagnosis Date   Adenocarcinoma of left lung, stage 1 (HCC)     ALLERGIES:  has No Known Allergies.  MEDICATIONS:  Current Outpatient Medications  Medication Sig Dispense Refill   albuterol (VENTOLIN HFA) 108 (90 Base) MCG/ACT inhaler Inhale 2 puffs into the lungs every 6 (six) hours as needed for wheezing or shortness of breath.     pregabalin (LYRICA) 25 MG capsule Take 1 capsule (25 mg total) by mouth 2 (two) times daily. 60 capsule 1   No current facility-administered medications for this visit.    SURGICAL HISTORY:  Past Surgical History:  Procedure Laterality Date   APPENDECTOMY     BRONCHIAL BIOPSY  10/02/2020   Procedure: BRONCHIAL BIOPSIES;  Surgeon: Collene Gobble, MD;  Location: [facility] ENDOSCOPY;  Service: Pulmonary;;   BRONCHIAL BRUSHINGS  10/02/2020   Procedure: BRONCHIAL BRUSHINGS;  Surgeon:  Collene Gobble, MD;  Location: [facility];  Service: Pulmonary;;   BRONCHIAL NEEDLE ASPIRATION BIOPSY  10/02/2020   Procedure: BRONCHIAL NEEDLE ASPIRATION BIOPSIES;  Surgeon: Collene Gobble, MD;  [facility] ENDOSCOPY;  Service: Pulmonary;;   BRONCHIAL WASHINGS  10/02/2020   Procedure: BRONCHIAL WASHINGS;  Surgeon: Collene Gobble, MD;  Location: [facility] ENDOSCOPY;  Service: Pulmonary;;   INTERCOSTAL NERVE BLOCK Left 12/08/2020   Procedure: INTERCOSTAL NERVE BLOCK;  Surgeon: Melrose Nakayama, MD;  Location: [facility];  Service: Thoracic;  Laterality: Left;   NODE DISSECTION Left 12/08/2020   Procedure: NODE DISSECTION;  Surgeon: Melrose Nakayama, MD;  Location: [facility];  Service: Thoracic;  Laterality: Left;   VIDEO BRONCHOSCOPY WITH ENDOBRONCHIAL NAVIGATION N/A 10/02/2020   Procedure: ROBOTIC BRONCHOSCOPY WITH ENDOBRONCHIAL NAVIGATION;  Surgeon: Collene Gobble, MD;  Location: [facility] ENDOSCOPY;  Service: Pulmonary;  Laterality: N/A;   VIDEO BRONCHOSCOPY WITH RADIAL ENDOBRONCHIAL ULTRASOUND  10/02/2020   Procedure: RADIAL ENDOBRONCHIAL ULTRASOUND;  Surgeon: Collene Gobble, MD;  Location: [facility] ENDOSCOPY;  Service: Pulmonary;;    REVIEW OF SYSTEMS:  A comprehensive review of systems was negative except for: Respiratory: positive for cough and sputum   PHYSICAL EXAMINATION: General appearance: alert, cooperative, and no distress Head: Normocephalic, without obvious abnormality, atraumatic Neck: no adenopathy, no JVD, supple, symmetrical, trachea midline, and thyroid not enlarged, symmetric, no tenderness/mass/nodules Lymph nodes: Cervical, supraclavicular, and axillary nodes normal. Resp: rhonchi RLL Back: symmetric, no curvature. ROM normal.  No CVA tenderness. Cardio: regular rate and rhythm, S1, S2 normal, no murmur, click, rub or gallop GI: soft, non-tender; bowel sounds normal; no masses,  no organomegaly Extremities: extremities normal, atraumatic, no cyanosis or edema  ECOG PERFORMANCE STATUS: 1  - Symptomatic but completely ambulatory  Blood pressure 114/84, pulse 82, temperature 98.1 F (36.7 C), temperature source Oral, resp. rate 16, weight 121 lb 11.2 oz (55.2 kg), SpO2 97 %.  LABORATORY DATA: Lab Results  Component Value Date   WBC 4.8 09/25/2021   HGB 14.7 09/25/2021   HCT 42.0 09/25/2021   MCV 96.3 09/25/2021   PLT 264 09/25/2021      Chemistry      Component Value Date/Time   NA 141 01/03/2021 0928   K 3.9 01/03/2021 0928   CL 105 01/03/2021 0928   CO2 26 01/03/2021 0928   BUN 11 01/03/2021 0928   CREATININE 0.86 01/03/2021 0928      Component Value Date/Time   CALCIUM 8.9 01/03/2021 0928   ALKPHOS 82 01/03/2021 0928   AST 18 01/03/2021 0928   ALT 7 01/03/2021 0928   BILITOT 0.6 01/03/2021 0928       RADIOGRAPHIC STUDIES: No results found.  ASSESSMENT AND PLAN: This is a very pleasant 46 years old white male with Stage Ib (T2 a, N0, M0 )non-small cell lung cancer, adenocarcinoma.  The patient presented with a left upper lobe pulmonary nodule.  He was diagnosed in August 2022. He is robotic assisted thorascopy left upper lobectomy under the care of Dr. Roxan Hockey on 12/08/2020.   The patient is currently on observation and he is feeling fine with no concerning complaints except for the cough that started few months ago productive of yellowish sputum. He had repeat CT scan of the chest performed recently.  I personally and independently reviewed the scans and discussed the results with the patient today. His scan showed no concerning finding for disease recurrence or metastasis.  It showed some infiltrate within the dependent left lower lobe and right middle lobe with associated bronchial wall thickening and airway impaction probably related to aspiration or acute infection. I recommended for the patient to continue on observation with repeat CT scan of the chest in 6 months. For the persistent cough and sputum production, I will start the patient on  doxycycline 100 mg p.o. twice daily for 7 days.  He was also advised to use Mucinex as needed.  If he has no improvement in his condition he will reach out to his primary care physician for additional treatment or recommendation. He was advised to call immediately if he has any other concerning symptoms in the interval. The patient voices understanding of current disease status and treatment options and is in agreement with the current care plan.  All questions were answered. The patient knows to call the clinic with any problems, questions or concerns. We can certainly see the patient much sooner if necessary.  The total time spent in the appointment was 20 minutes.  Disclaimer: This note was dictated with voice recognition software. Similar sounding words can inadvertently be transcribed and may not be corrected upon review.

## 2022-01-23 DIAGNOSIS — F1020 Alcohol dependence, uncomplicated: Secondary | ICD-10-CM | POA: Insufficient documentation

## 2022-02-05 DIAGNOSIS — C3492 Malignant neoplasm of unspecified part of left bronchus or lung: Secondary | ICD-10-CM | POA: Insufficient documentation

## 2022-02-12 ENCOUNTER — Telehealth: Payer: Self-pay

## 2022-02-12 ENCOUNTER — Telehealth: Payer: Self-pay | Admitting: Medical Oncology

## 2022-02-12 NOTE — Telephone Encounter (Signed)
Requested to move up CT scan  New Symptom- Chest hurts "around my heart" described as "stabbing" x 3 days . He states these symptoms are not going  away and that they are  like the ones he had when he was first diagnosed. Denies light headed , dizzy. His voice is calm.  He completed two rounds of antibiotics for pneumonia in early dec.  I instructed pt to go to ED if symptom get worsen or has associated new symptoms . Pt voiced understanding.

## 2022-02-12 NOTE — Telephone Encounter (Signed)
Patient contacted the office concerned about "same pain, coming back" and that it feels as if it is the same pain he was having when his lung nodule was found. He states that it is in his chest "around my heart", "stabbing pain", that does come and go but getting more intense. He is s/p RATS LULobectomy with Dr. Roxan Hockey 10/14. He went to the hospital back in November and diagnosed with pneumonia. He states that those symptoms have subsided. He did have a chest xray at that time. Advised that if he is having chest pain, he should not wait for an appointment in the office and he should go to the ED for further work-up/evaluation. His appointment was moved up with Dr. Roxan Hockey 03/04/22 but advised that he needed to follow-up with Dr. Earlie Server as well with a CT chest before his appt with Dr. Roxan Hockey. He acknowledged receipt and states that he will call back if he is unable to get CT before his appointment with Dr. Roxan Hockey.

## 2022-02-13 ENCOUNTER — Other Ambulatory Visit: Payer: Self-pay | Admitting: Medical Oncology

## 2022-02-13 NOTE — Telephone Encounter (Signed)
Per Dr. Julien Nordmann pt's CT scan date moved up  to this week, and pt given number to call for CT scan appt.

## 2022-02-13 NOTE — Telephone Encounter (Signed)
Returned pt call. I gave him the number to central scheduling again to schedule his CT scan.

## 2022-02-14 ENCOUNTER — Telehealth: Payer: Self-pay | Admitting: Internal Medicine

## 2022-02-14 ENCOUNTER — Telehealth: Payer: Self-pay

## 2022-02-14 NOTE — Telephone Encounter (Signed)
Patient called to r/s ct. Transferred patient to that department so they can move ct to earlier date.

## 2022-02-14 NOTE — Telephone Encounter (Signed)
This nurse returned a call to patient who wanted to know how can he reschedule his CT scan and lab appointment. This nurse provided the phone number for central scheduling.  This nurse also advised the patient to send [place] a My Chart message to let us know what the new appointment date is and we can update the lab appointment because the labs are needed prior to the CT scan.  Patient acknowledged understanding.  No further questions or concerns noted at this time.

## 2022-02-20 ENCOUNTER — Ambulatory Visit (HOSPITAL_COMMUNITY): Payer: Commercial Managed Care - PPO | Attending: Internal Medicine

## 2022-02-26 ENCOUNTER — Other Ambulatory Visit: Payer: Self-pay

## 2022-02-26 ENCOUNTER — Emergency Department (HOSPITAL_COMMUNITY): Payer: Commercial Managed Care - PPO

## 2022-02-26 ENCOUNTER — Emergency Department (HOSPITAL_COMMUNITY)
Admission: EM | Admit: 2022-02-26 | Discharge: 2022-02-26 | Disposition: A | Discharge status: Home / Self Care | Payer: Commercial Managed Care - PPO | Attending: Emergency Medicine | Admitting: Emergency Medicine

## 2022-02-26 ENCOUNTER — Encounter (HOSPITAL_COMMUNITY): Payer: Self-pay | Admitting: *Deleted

## 2022-02-26 DIAGNOSIS — Z1152 Encounter for screening for COVID-19: Secondary | ICD-10-CM | POA: Insufficient documentation

## 2022-02-26 DIAGNOSIS — Z85118 Personal history of other malignant neoplasm of bronchus and lung: Secondary | ICD-10-CM | POA: Insufficient documentation

## 2022-02-26 DIAGNOSIS — C3492 Malignant neoplasm of unspecified part of left bronchus or lung: Secondary | ICD-10-CM

## 2022-02-26 DIAGNOSIS — R112 Nausea with vomiting, unspecified: Secondary | ICD-10-CM | POA: Diagnosis present

## 2022-02-26 DIAGNOSIS — R197 Diarrhea, unspecified: Secondary | ICD-10-CM | POA: Diagnosis not present

## 2022-02-26 LAB — RESP PANEL BY RT-PCR (RSV, FLU A&B, COVID)  RVPGX2
Influenza A by PCR: NEGATIVE
Influenza B by PCR: NEGATIVE
Resp Syncytial Virus by PCR: NEGATIVE
SARS Coronavirus 2 by RT PCR: NEGATIVE

## 2022-02-26 MED ORDER — ALBUTEROL SULFATE HFA 108 (90 BASE) MCG/ACT IN AERS: 2.0000 | INHALATION_SPRAY | RESPIRATORY_TRACT | Status: DC | PRN

## 2022-02-26 MED ORDER — ONDANSETRON HCL 4 MG PO TABS: 4.0000 mg | ORAL_TABLET | Freq: Three times a day (TID) | ORAL | 0 refills | Status: DC | PRN

## 2022-02-26 NOTE — ED Provider Triage Note (Signed)
Emergency Medicine Provider Triage Evaluation Note  Alex Sims , a 47 y.o. male  was evaluated in triage.  Pt complains of generalized body aches, cough, nausea vomiting diarrhea.  Symptoms present for 1 day.  States significant other has recently had similar symptoms.  He was diagnosed with lung cancer and had partial lung resection last year.  He was seen at another hospital last week and diagnosed with pneumothorax.  He was recommended to return for follow-up, but patient was unable to do so.  He did not require chest tube.  He denies any worsening shortness of breath or worsening chest pain.  No known fever at home.  Review of Systems  Positive: Generalized bodyaches, cough, nausea vomiting and diarrhea Negative: Fever, worsening chest pain or shortness of breath  Physical Exam  There were no vitals taken for this visit. Gen:   Awake, no distress   Resp:  Normal effort  MSK:   Moves extremities without difficulty  Other:  No erythema or edema of the oropharynx, abdomen soft nontender  Medical Decision Making  Medically screening exam initiated at 4:06 PM.  Appropriate orders placed.  Alex Sims was informed that the remainder of the evaluation will be completed by another provider, this initial triage assessment does not replace that evaluation, and the importance of remaining in the ED until their evaluation is complete.     Kem Parkinson, PA-C 02/26/22 1622

## 2022-02-26 NOTE — ED Notes (Signed)
AVS with prescriptions provided to and discussed with patient. Pt verbalizes understanding of discharge instructions and denies any questions or concerns at this time. Pt ambulated out of department independently with steady gait. ? ?

## 2022-02-26 NOTE — ED Triage Notes (Addendum)
Pt is here due to feeling nauseated and body aches and chills and feeling generally ill since yesterday.  Pt also notes that he has hx of lung CA last year and that last week he was dx with a "collapsed lung" but told he could be discharged and to come back if he was not feeling well. Prt also reports sob and chest pain in left chest. EDP notifed to [facility]

## 2022-02-26 NOTE — Discharge Instructions (Addendum)
You are seen in the emergency department for nausea vomiting diarrhea and worsening shortness of breath.  Your chest x-ray was unremarkable and your COVID flu and RSV test were negative.  You were offered further testing and declined at this time.  We are prescribing you some nausea medication.  Please follow-up with your oncology team.  Return to the emergency department if any worsening or concerning symptoms.

## 2022-02-26 NOTE — ED Provider Notes (Signed)
[facility] EMERGENCY DEPARTMENT Provider Note   CSN: [id number] Arrival date & time: 02/26/22  1457     History {Add pertinent medical, surgical, social history, OB history to HPI:1} Chief Complaint  Patient presents with   Shortness of Breath    Alex Sims is a 47 y.o. male.  He has a history of lung cancer and follows with Dr. Earlie Server.  He was seen at outside hospital a few weeks ago for cough shortness of breath and found to have small pneumothorax on the left.  Yesterday complaining of nausea vomiting diarrhea body aches feeling hot and feeling cold.  He has some mild shortness of breath.  His significant other is sick with similar symptoms.  He wanted to come here to make sure that the pneumothorax was not worse.  He had prior lung resection  The history is provided by the patient.  Influenza Presenting symptoms: diarrhea, myalgias, nausea, shortness of breath and vomiting   Shortness of breath:    Severity:  Moderate   Timing:  Intermittent   Progression:  Unchanged Severity:  Moderate Onset quality:  Sudden Duration:  2 days Progression:  Unchanged Chronicity:  New Relieved by:  Nothing Worsened by:  Nothing Ineffective treatments:  None tried Associated symptoms: chills   Associated symptoms: no mental status change   Risk factors: sick contacts        Home Medications Prior to Admission medications   Medication Sig Start Date End Date Taking? Authorizing Provider  albuterol (VENTOLIN HFA) 108 (90 Base) MCG/ACT inhaler Inhale 2 puffs into the lungs every 6 (six) hours as needed for wheezing or shortness of breath. 08/14/20   [provider]  pregabalin (LYRICA) 25 MG capsule Take 1 capsule (25 mg total) by mouth 2 (two) times daily. 04/10/21   Melrose Nakayama, MD      Allergies    Patient has no known allergies.    Review of Systems   Review of Systems  Constitutional:  Positive for chills.  Respiratory:  Positive for shortness of  breath.   Gastrointestinal:  Positive for diarrhea, nausea and vomiting. Negative for abdominal pain.  Genitourinary:  Negative for dysuria.  Musculoskeletal:  Positive for myalgias.  Skin:  Negative for rash.    Physical Exam Updated Vital Signs BP 132/88   Pulse 77   Temp 98.1 F (36.7 C) (Oral)   Resp 16   SpO2 99%  Physical Exam Vitals and nursing note reviewed.  Constitutional:      General: He is not in acute distress.    Appearance: Normal appearance. He is well-developed.  HENT:     Head: Normocephalic and atraumatic.  Eyes:     Conjunctiva/sclera: Conjunctivae normal.  Cardiovascular:     Rate and Rhythm: Normal rate and regular rhythm.     Heart sounds: No murmur heard. Pulmonary:     Effort: Pulmonary effort is normal. No respiratory distress.     Breath sounds: Normal breath sounds.  Abdominal:     Palpations: Abdomen is soft.     Tenderness: There is no abdominal tenderness. There is no guarding or rebound.  Musculoskeletal:        General: No swelling. Normal range of motion.     Cervical back: Neck supple.     Right lower leg: No tenderness. No edema.     Left lower leg: No tenderness. No edema.  Skin:    General: Skin is warm and dry.     Capillary Refill: Capillary  refill takes less than 2 seconds.  Neurological:     Mental Status: He is alert.  Psychiatric:        Mood and Affect: Mood normal.     ED Results / Procedures / Treatments   Labs (all labs ordered are listed, but only abnormal results are displayed) Labs Reviewed  RESP PANEL BY RT-PCR (RSV, FLU A&B, COVID)  [id number]    EKG EKG Interpretation  Date/Time:  Tuesday February 26 2022 15:41:29 EST Ventricular Rate:  79 PR Interval:  154 QRS Duration: 74 QT Interval:  370 QTC Calculation: 424 R Axis:   91 Text Interpretation: Normal sinus rhythm Rightward axis Borderline ECG When compared with ECG of 06-Dec-2020 14:16, Vent. rate has increased BY  29 BPM T wave amplitude has  decreased in Lateral leads u waves Confirmed by Aletta Edouard [phone]) on 02/26/2022 3:44:00 PM  Radiology DG Chest 2 View  Result Date: 02/26/2022 CLINICAL DATA:  Recent pneumothorax EXAM: CHEST - 2 VIEW COMPARISON:  CT 08/17/2021 FINDINGS: Normal mediastinum and cardiac silhouette. Normal pulmonary vasculature. No evidence of effusion, infiltrate, or pneumothorax. No acute bony abnormality. IMPRESSION: No acute cardiopulmonary process. Electronically Signed   By: Suzy Bouchard M.D.   On: 02/26/2022 17:25    Procedures Procedures  {Document cardiac monitor, telemetry assessment procedure when appropriate:1}  Medications Ordered in ED Medications  albuterol (VENTOLIN HFA) 108 (90 Base) MCG/ACT inhaler 2 puff (has no administration in time range)    ED Course/ Medical Decision Making/ A&P                           Medical Decision Making Amount and/or Complexity of Data Reviewed Radiology: ordered.  Risk Prescription drug management.   This patient complains of ***; this involves an extensive number of treatment Options and is a complaint that carries with it a high risk of complications and morbidity. The differential includes ***  I ordered, reviewed and interpreted labs, which included *** I ordered medication *** and reviewed PMP when indicated. I ordered imaging studies which included *** and I independently    visualized and interpreted imaging which showed *** Additional history obtained from *** Previous records obtained and reviewed *** I consulted *** and discussed lab and imaging findings and discussed disposition.  Cardiac monitoring reviewed, *** Social determinants considered, *** Critical Interventions: ***  After the interventions stated above, I reevaluated the patient and found *** Admission and further testing considered, ***   {Document critical care time when appropriate:1} {Document review of labs and clinical decision tools ie heart score,  Chads2Vasc2 etc:1}  {Document your independent review of radiology images, and any outside records:1} {Document your discussion with family members, caretakers, and with consultants:1} {Document social determinants of health affecting pt's care:1} {Document your decision making why or why not admission, treatments were needed:1} Final Clinical Impression(s) / ED Diagnoses Final diagnoses:  None    Rx / DC Orders ED Discharge Orders     None

## 2022-03-04 ENCOUNTER — Encounter: Payer: Commercial Managed Care - PPO | Admitting: Thoracic Surgery (Cardiothoracic Vascular Surgery)

## 2022-03-04 ENCOUNTER — Telehealth: Payer: Self-pay | Admitting: Medical Oncology

## 2022-03-04 NOTE — Telephone Encounter (Signed)
LVM to return my call °

## 2022-03-07 ENCOUNTER — Ambulatory Visit (HOSPITAL_COMMUNITY): Payer: Commercial Managed Care - PPO

## 2022-03-11 ENCOUNTER — Other Ambulatory Visit: Payer: Self-pay

## 2022-03-11 DIAGNOSIS — C3490 Malignant neoplasm of unspecified part of unspecified bronchus or lung: Secondary | ICD-10-CM

## 2022-03-12 ENCOUNTER — Inpatient Hospital Stay: Payer: Commercial Managed Care - PPO | Attending: Internal Medicine

## 2022-03-12 ENCOUNTER — Ambulatory Visit (HOSPITAL_COMMUNITY): Payer: Commercial Managed Care - PPO | Attending: Internal Medicine

## 2022-03-15 ENCOUNTER — Telehealth: Payer: Self-pay | Admitting: Internal Medicine

## 2022-03-15 NOTE — Telephone Encounter (Signed)
Called patient regarding February appointments, left a voicemail.

## 2022-03-26 ENCOUNTER — Telehealth: Payer: Self-pay | Admitting: Internal Medicine

## 2022-03-26 ENCOUNTER — Other Ambulatory Visit (HOSPITAL_COMMUNITY): Payer: Commercial Managed Care - PPO

## 2022-03-26 ENCOUNTER — Other Ambulatory Visit: Payer: Commercial Managed Care - PPO

## 2022-03-26 NOTE — Telephone Encounter (Signed)
Rescheduled 02/06 appointment due to provider on-call. Called and left a voicemail regarding new rescheduled time.

## 2022-03-28 ENCOUNTER — Ambulatory Visit: Payer: Commercial Managed Care - PPO | Admitting: Internal Medicine

## 2022-04-02 ENCOUNTER — Encounter: Payer: Commercial Managed Care - PPO | Admitting: Thoracic Surgery (Cardiothoracic Vascular Surgery)

## 2022-04-02 ENCOUNTER — Ambulatory Visit: Payer: Commercial Managed Care - PPO | Admitting: Internal Medicine

## 2022-04-02 ENCOUNTER — Telehealth: Payer: Self-pay | Admitting: Physician Assistant

## 2022-04-02 ENCOUNTER — Encounter: Payer: Self-pay | Admitting: Physician Assistant

## 2022-04-02 NOTE — Telephone Encounter (Signed)
R/s per 2/6 Cassie's request, left message with pt about appt details and number to set up his scan

## 2022-04-02 NOTE — Progress Notes (Unsigned)
[place] OFFICE PROGRESS NOTE  Pcp, No No address on file  DIAGNOSIS: Stage Ib (T2 a, N0, M0 )non-small cell lung cancer, adenocarcinoma.  The patient presented with a left upper lobe pulmonary nodule.  He was diagnosed in August 2022.   PRIOR THERAPY: 1) robotic assisted thorascopy left upper lobectomy under the care of Dr. Roxan Hockey on 12/08/2020   CURRENT THERAPY: Observation    INTERVAL HISTORY: Morrill Bomkamp 47 y.o. male returns to the clinic today for a 2-month follow-up visit.  The patient was last seen by Dr. Julien Nordmann on 09/25/2021.  In the interval since last being seen, the patient has had several emergency room visits.  He was seen in the emergency room in November 2023 after having a 30-minute episode of hemoptysis.  He was reportedly offered a CT scan but declined.  Approximately 1 month later on 02/13/2022 the patient was endorsing a few days of pleuritic chest pain.  He had a chest x-ray that showed a small left pneumothorax.  Due to the small size, no chest tube was required.  He had called the clinic around that time and the recommendation was to move up his restaging CT scan, although it had not been done.  He was then seen again in the ER for shortness of breath in January 2024 in which his chest x-ray at that time did not show any acute findings.  Since last being seen in the emergency room he denies any other changes in his health.  Denies any fever, chills, or unexplained weight loss.  He has had night sweats in the past but none recently.  He reports sometimes his left chest feels sore since his surgery.  He never picked up the Lyrica that Dr. Roxan Hockey had sent him.  He reports ever since his surgery he has had baseline dyspnea on exertion and cough.  Denies any recent episodes of hemoptysis since he was seen in the emergency room in November.  Denies any nausea, vomiting, diarrhea, or constipation.  Denies any headache or visual changes.  The patient quit  smoking when he was diagnosed with lung cancer.  The patient is currently on observation after having a left upper lobectomy under the care of Dr. Roxan Hockey in October 2022.  He is here today to review his scan results and for a more detailed discussion about his current condition and recommended treatment options.  MEDICAL HISTORY: Past Medical History:  Diagnosis Date   Adenocarcinoma of left lung, stage 1 (HCC)     ALLERGIES:  has No Known Allergies.  MEDICATIONS:  Current Outpatient Medications  Medication Sig Dispense Refill   albuterol (VENTOLIN HFA) 108 (90 Base) MCG/ACT inhaler Inhale 2 puffs into the lungs every 6 (six) hours as needed for wheezing or shortness of breath.     ondansetron (ZOFRAN) 4 MG tablet Take 1 tablet (4 mg total) by mouth every 8 (eight) hours as needed for nausea or vomiting. 15 tablet 0   pregabalin (LYRICA) 25 MG capsule Take 1 capsule (25 mg total) by mouth 2 (two) times daily. 60 capsule 1   No current facility-administered medications for this visit.    SURGICAL HISTORY:  Past Surgical History:  Procedure Laterality Date   APPENDECTOMY     BRONCHIAL BIOPSY  10/02/2020   Procedure: BRONCHIAL BIOPSIES;  Surgeon: Collene Gobble, MD;  Location: [facility] ENDOSCOPY;  Service: Pulmonary;;   BRONCHIAL BRUSHINGS  10/02/2020   Procedure: BRONCHIAL BRUSHINGS;  Surgeon: Collene Gobble, MD;  Location:  [place] ENDOSCOPY;  Service: Pulmonary;;   BRONCHIAL NEEDLE ASPIRATION BIOPSY  10/02/2020   Procedure: BRONCHIAL NEEDLE ASPIRATION BIOPSIES;  Surgeon: Collene Gobble, MD;  Location: [facility] ENDOSCOPY;  Service: Pulmonary;;   BRONCHIAL WASHINGS  10/02/2020   Procedure: BRONCHIAL WASHINGS;  Surgeon: Collene Gobble, MD;  Location: [facility] ENDOSCOPY;  Service: Pulmonary;;   INTERCOSTAL NERVE BLOCK Left 12/08/2020   Procedure: INTERCOSTAL NERVE BLOCK;  Surgeon: Melrose Nakayama, MD;  Location: [name];  Service: Thoracic;  Laterality: Left;   NODE DISSECTION Left 12/08/2020    Procedure: NODE DISSECTION;  Surgeon: Melrose Nakayama, MD;  Location: [place];  Service: Thoracic;  Laterality: Left;   VIDEO BRONCHOSCOPY WITH ENDOBRONCHIAL NAVIGATION N/A 10/02/2020   Procedure: ROBOTIC BRONCHOSCOPY WITH ENDOBRONCHIAL NAVIGATION;  Surgeon: Collene Gobble, MD;  Location: [facility] ENDOSCOPY;  Service: Pulmonary;  Laterality: N/A;   VIDEO BRONCHOSCOPY WITH RADIAL ENDOBRONCHIAL ULTRASOUND  10/02/2020   Procedure: RADIAL ENDOBRONCHIAL ULTRASOUND;  Surgeon: Collene Gobble, MD;  Location: [facility] ENDOSCOPY;  Service: Pulmonary;;    REVIEW OF SYSTEMS:   Review of Systems  Constitutional: Negative for appetite change, chills, fatigue, fever and unexpected weight change.  HENT: Negative for mouth sores, nosebleeds, sore throat and trouble swallowing.   Eyes: Negative for eye problems and icterus.  Respiratory: Positive for baseline dyspnea on exertion and cough.  Negative for hemoptysis and wheezing.   Cardiovascular: Positive for persistent left chest soreness since surgery.  Negative for leg swelling.  Gastrointestinal: Negative for abdominal pain, constipation, diarrhea, nausea and vomiting.  Genitourinary: Negative for bladder incontinence, difficulty urinating, dysuria, frequency and hematuria.   Musculoskeletal: Negative for back pain, gait problem, neck pain and neck stiffness.  Skin: Negative for itching and rash.  Neurological: Negative for dizziness, extremity weakness, gait problem, headaches, light-headedness and seizures.  Hematological: Negative for adenopathy. Does not bruise/bleed easily.  Psychiatric/Behavioral: Negative for confusion, depression and sleep disturbance. The patient is not nervous/anxious.     PHYSICAL EXAMINATION:  Blood pressure 115/79, pulse 67, temperature 97.7 F (36.5 C), resp. rate 20, weight 131 lb 1.6 oz (59.5 kg), SpO2 97 %.  ECOG PERFORMANCE STATUS: 1  Physical Exam  Constitutional: Oriented to person, place, and time and thin appearing male  and in no distress.  HENT:  Head: Normocephalic and atraumatic.  Mouth/Throat: Oropharynx is clear and moist. No oropharyngeal exudate.  Eyes: Conjunctivae are normal. Right eye exhibits no discharge. Left eye exhibits no discharge. No scleral icterus.  Neck: Normal range of motion. Neck supple.  Cardiovascular: Normal rate, regular rhythm, normal heart sounds and intact distal pulses.   Pulmonary/Chest: Effort normal and breath sounds normal. No respiratory distress. No wheezes. No rales.  Abdominal: Soft. Bowel sounds are normal. Exhibits no distension and no mass. There is no tenderness.  Musculoskeletal: Normal range of motion. Exhibits no edema.  Lymphadenopathy:    No cervical adenopathy.  Neurological: Alert and oriented to person, place, and time. Exhibits normal muscle tone. Gait normal. Coordination normal.  Skin: Skin is warm and dry. No rash noted. Not diaphoretic. No erythema. No pallor.  Psychiatric: Mood, memory and judgment normal.  Vitals reviewed.  LABORATORY DATA: Lab Results  Component Value Date   WBC 5.6 04/03/2022   HGB 14.7 04/03/2022   HCT 41.4 04/03/2022   MCV 97.0 04/03/2022   PLT 147 (L) 04/03/2022      Chemistry      Component Value Date/Time   NA 140 04/03/2022 1346   K 3.3 (L) 04/03/2022 1346  CL 104 04/03/2022 1346   CO2 30 04/03/2022 1346   BUN 12 04/03/2022 1346   CREATININE 0.68 04/03/2022 1346      Component Value Date/Time   CALCIUM 9.1 04/03/2022 1346   ALKPHOS 72 04/03/2022 1346   AST 43 (H) 04/03/2022 1346   ALT 9 04/03/2022 1346   BILITOT 1.4 (H) 04/03/2022 1346       RADIOGRAPHIC STUDIES:  CT Chest W Contrast  Result Date: 04/04/2022 CLINICAL DATA:  Non-small-cell lung cancer. Staging. Adenocarcinoma. * Tracking Code: BO * EXAM: CT CHEST WITH CONTRAST TECHNIQUE: Multidetector CT imaging of the chest was performed during intravenous contrast administration. RADIATION DOSE REDUCTION: This exam was performed according to the  departmental dose-optimization program which includes automated exposure control, adjustment of the mA and/or kV according to patient size and/or use of iterative reconstruction technique. CONTRAST:  53mL OMNIPAQUE IOHEXOL 300 MG/ML  SOLN COMPARISON:  08/17/2021 chest CT FINDINGS: Cardiovascular: Aortic atherosclerosis. Normal heart size, without pericardial effusion. No central pulmonary embolism, on this non-dedicated study. Mediastinum/Nodes: No supraclavicular adenopathy. No mediastinal or hilar adenopathy. Lungs/Pleura: No pleural fluid.  Left upper lobectomy. Minimal ground-glass within the inferior right upper lobe at 5 mm on 82/5, new. Significant improvement in left lower lobe aeration. Minimal residual peribronchovascular ground-glass and mucoid impaction remain. Mucoid impaction and "tree-in-bud" nodularity on the prior exam have primarily resolved. Upper Abdomen: Hepatic steatosis. Normal imaged portions of the spleen, stomach, pancreas, adrenal glands, kidneys. Musculoskeletal: No acute osseous abnormality. IMPRESSION: 1. Status post left upper lobectomy, without recurrent or metastatic disease. 2. Significantly improved left lower lobe infection or aspiration. Minimal inferior right upper lobe ground-glass is favored to represent a new focus of infection. Recommend attention on follow-up. 3. Hepatic steatosis 4.  Aortic Atherosclerosis (ICD10-I70.0). Electronically Signed   By: Abigail Miyamoto M.D.   On: 04/04/2022 08:19     ASSESSMENT/PLAN:  This is a very pleasant 47 year old Caucasian male diagnosed with stage Ib (T2 a, N0, M0) small cell lung cancer, adenocarcinoma.  The patient presented with a left upper lobe lung mass.  He was diagnosed in August 2022.  The patient is status post left upper lobectomy under the care of Dr. Roxan Hockey which was performed on 12/08/2020. The patient had 13 negative lymph nodes. There was visceral pleural involvement.   The patient had been on observation and  fair. In the fall of fall 2023 when he had numerous emergency room visits.  He had emergency room visit for hemoptysis, and emergency room visit for a small left-sided pneumothorax, and emergency room visit for shortness of breath.  The patient recently had a restaging CT scan performed.  Dr. Julien Nordmann personally and independently reviewed the scan and discussed the results with the patient today.  The scan showed findings of recurrent or metastatic disease.  Of note there was significant improvement in the left lower lobe infection/aspiration that was seen on prior imaging.  Dr. Julien Nordmann recommends that the patient continue on observation with a restaging CT scan the chest in 6 months.  Dr. Julien Nordmann feels that the patient's shortness of breath is likely secondary to having a lobectomy.  Dr. Julien Nordmann recommends increasing his activity/conditioning.  Of note, the patient's scan did show hepatic steatosis and his liver enzyme was minimally elevated today.  Recommended that he see his family practice provider for risk factor reduction in the future and to cut back on alcohol consumption.  I will send in a one-time refill of Lyrica that Dr. Roxan Hockey had prescribed which  may help with his nerve pain following his surgery.  The patient was advised to call immediately if he has any concerning symptoms in the interval. The patient voices understanding of current disease status and treatment options and is in agreement with the current care plan. All questions were answered. The patient knows to call the clinic with any problems, questions or concerns. We can certainly see the patient much sooner if necessary      Orders Placed This Encounter  Procedures   CT Chest W Contrast    Standing Status:   Future    Standing Expiration Date:   04/04/2023    Order Specific Question:   If indicated for the ordered procedure, I authorize the administration of contrast media per Radiology protocol    Answer:   Yes     Order Specific Question:   Does the patient have a contrast media/X-ray dye allergy?    Answer:   No    Order Specific Question:   Preferred imaging location?    Answer:   [facility]   CBC with Differential ([place] Only)    Standing Status:   Future    Standing Expiration Date:   04/05/2023   CMP ([place] only)    Standing Status:   Future    Standing Expiration Date:   04/05/2023      Tobe Sos Nury Nebergall, PA-C 04/04/22  ADDENDUM: Hematology/Oncology Attending: I had a face-to-face encounter with the patient today.  I reviewed his record, lab, scan and recommended his care plan.  This is a very pleasant 47 years old white male with history of stage Ib non-small cell lung cancer, adenocarcinoma status post left upper lobectomy under the care of Dr. Roxan Hockey in October 2022 and the patient has been in observation since that time.  The patient is feeling fine with no concerning complaints except for occasional shortness of breath with exertion. He also has some pain on the left side of the chest from the surgical scar. He had repeat CT scan of the chest performed recently.  I personally independently reviewed the scan and discussed the result with the patient today. His scan showed no concerning findings for disease recurrence or metastasis. I recommended for him to continue on observation with repeat CT scan of the chest in 6 months. The patient was advised to call immediately if he has any other concerning symptoms in the interval. The total time spent in the appointment was 20 minutes. Disclaimer: This note was dictated with voice recognition software. Similar sounding words can inadvertently be transcribed and may be missed upon review. Eilleen Kempf, MD

## 2022-04-03 ENCOUNTER — Inpatient Hospital Stay: Payer: Commercial Managed Care - PPO | Attending: Internal Medicine

## 2022-04-03 ENCOUNTER — Encounter: Payer: Self-pay | Admitting: Medical Oncology

## 2022-04-03 ENCOUNTER — Encounter (HOSPITAL_COMMUNITY): Payer: Self-pay

## 2022-04-03 ENCOUNTER — Other Ambulatory Visit: Payer: Self-pay

## 2022-04-03 ENCOUNTER — Ambulatory Visit (HOSPITAL_COMMUNITY)
Admission: RE | Admit: 2022-04-03 | Discharge: 2022-04-03 | Disposition: A | Discharge status: Home / Self Care | Payer: Commercial Managed Care - PPO | Source: Ambulatory Visit | Attending: Internal Medicine | Admitting: Internal Medicine

## 2022-04-03 DIAGNOSIS — I700 Atherosclerosis of aorta: Secondary | ICD-10-CM | POA: Insufficient documentation

## 2022-04-03 DIAGNOSIS — C3490 Malignant neoplasm of unspecified part of unspecified bronchus or lung: Secondary | ICD-10-CM

## 2022-04-03 DIAGNOSIS — R059 Cough, unspecified: Secondary | ICD-10-CM | POA: Insufficient documentation

## 2022-04-03 DIAGNOSIS — R0609 Other forms of dyspnea: Secondary | ICD-10-CM | POA: Insufficient documentation

## 2022-04-03 DIAGNOSIS — Z902 Acquired absence of lung [part of]: Secondary | ICD-10-CM | POA: Insufficient documentation

## 2022-04-03 DIAGNOSIS — R0602 Shortness of breath: Secondary | ICD-10-CM | POA: Insufficient documentation

## 2022-04-03 DIAGNOSIS — C3412 Malignant neoplasm of upper lobe, left bronchus or lung: Secondary | ICD-10-CM | POA: Insufficient documentation

## 2022-04-03 DIAGNOSIS — Z87891 Personal history of nicotine dependence: Secondary | ICD-10-CM | POA: Insufficient documentation

## 2022-04-03 DIAGNOSIS — Z9049 Acquired absence of other specified parts of digestive tract: Secondary | ICD-10-CM | POA: Insufficient documentation

## 2022-04-03 DIAGNOSIS — K760 Fatty (change of) liver, not elsewhere classified: Secondary | ICD-10-CM | POA: Insufficient documentation

## 2022-04-03 DIAGNOSIS — R042 Hemoptysis: Secondary | ICD-10-CM | POA: Insufficient documentation

## 2022-04-03 DIAGNOSIS — Z79899 Other long term (current) drug therapy: Secondary | ICD-10-CM | POA: Insufficient documentation

## 2022-04-03 LAB — CMP (CANCER CENTER ONLY)
ALT: 9 U/L (ref 0–44)
AST: 43 U/L — ABNORMAL HIGH (ref 15–41)
Albumin: 4.2 g/dL (ref 3.5–5.0)
Alkaline Phosphatase: 72 U/L (ref 38–126)
Anion gap: 6 (ref 5–15)
BUN: 12 mg/dL (ref 6–20)
CO2: 30 mmol/L (ref 22–32)
Calcium: 9.1 mg/dL (ref 8.9–10.3)
Chloride: 104 mmol/L (ref 98–111)
Creatinine: 0.68 mg/dL (ref 0.61–1.24)
GFR, Estimated: >60 mL/min (ref >60)
Glucose, Bld: 83 mg/dL (ref 70–99)
Potassium: 3.3 mmol/L — ABNORMAL LOW (ref 3.5–5.1)
Sodium: 140 mmol/L (ref 135–145)
Total Bilirubin: 1.4 mg/dL — ABNORMAL HIGH (ref 0.3–1.2)
Total Protein: 6.9 g/dL (ref 6.5–8.1)

## 2022-04-03 LAB — CBC WITH DIFFERENTIAL (CANCER CENTER ONLY)
Abs Immature Granulocytes: 0.01 10*3/uL (ref 0.00–0.07)
Basophils Absolute: 0 10*3/uL (ref 0.0–0.1)
Basophils Relative: 1 %
Eosinophils Absolute: 0 10*3/uL (ref 0.0–0.5)
Eosinophils Relative: 0 %
HCT: 41.4 % (ref 39.0–52.0)
Hemoglobin: 14.7 g/dL (ref 13.0–17.0)
Immature Granulocytes: 0 %
Lymphocytes Relative: 12 %
Lymphs Abs: 0.7 10*3/uL (ref 0.7–4.0)
MCH: 34.4 pg — ABNORMAL HIGH (ref 26.0–34.0)
MCHC: 35.5 g/dL (ref 30.0–36.0)
MCV: 97 fL (ref 80.0–100.0)
Monocytes Absolute: 0.4 10*3/uL (ref 0.1–1.0)
Monocytes Relative: 7 %
Neutro Abs: 4.4 10*3/uL (ref 1.7–7.7)
Neutrophils Relative %: 80 %
Platelet Count: 147 10*3/uL — ABNORMAL LOW (ref 150–400)
RBC: 4.27 MIL/uL (ref 4.22–5.81)
RDW: 12.6 % (ref 11.5–15.5)
WBC Count: 5.6 10*3/uL (ref 4.0–10.5)
nRBC: 0 % (ref 0.0–0.2)

## 2022-04-03 MED ORDER — SODIUM CHLORIDE (PF) 0.9 % IJ SOLN
INTRAMUSCULAR | Status: AC
  Filled 2022-04-03: qty 50

## 2022-04-03 MED ORDER — IOHEXOL 300 MG/ML  SOLN
75.0000 mL | Freq: Once | INTRAMUSCULAR | Status: AC | PRN
  Administered 2022-04-03: 75 mL via INTRAVENOUS

## 2022-04-04 ENCOUNTER — Inpatient Hospital Stay (HOSPITAL_BASED_OUTPATIENT_CLINIC_OR_DEPARTMENT_OTHER): Payer: Commercial Managed Care - PPO | Admitting: Physician Assistant

## 2022-04-04 VITALS — BP 115/79 | HR 67 | Temp 97.7°F | Resp 20 | Wt 131.1 lb

## 2022-04-04 DIAGNOSIS — C3492 Malignant neoplasm of unspecified part of left bronchus or lung: Secondary | ICD-10-CM | POA: Diagnosis not present

## 2022-04-04 DIAGNOSIS — Z79899 Other long term (current) drug therapy: Secondary | ICD-10-CM | POA: Diagnosis not present

## 2022-04-04 DIAGNOSIS — R059 Cough, unspecified: Secondary | ICD-10-CM | POA: Diagnosis not present

## 2022-04-04 DIAGNOSIS — C3412 Malignant neoplasm of upper lobe, left bronchus or lung: Secondary | ICD-10-CM | POA: Diagnosis present

## 2022-04-04 DIAGNOSIS — Z902 Acquired absence of lung [part of]: Secondary | ICD-10-CM | POA: Diagnosis not present

## 2022-04-04 DIAGNOSIS — Z87891 Personal history of nicotine dependence: Secondary | ICD-10-CM | POA: Diagnosis not present

## 2022-04-04 DIAGNOSIS — R0602 Shortness of breath: Secondary | ICD-10-CM | POA: Diagnosis not present

## 2022-04-04 DIAGNOSIS — R0609 Other forms of dyspnea: Secondary | ICD-10-CM | POA: Diagnosis not present

## 2022-04-04 DIAGNOSIS — R042 Hemoptysis: Secondary | ICD-10-CM | POA: Diagnosis not present

## 2022-04-04 DIAGNOSIS — K760 Fatty (change of) liver, not elsewhere classified: Secondary | ICD-10-CM | POA: Diagnosis not present

## 2022-04-04 DIAGNOSIS — Z9049 Acquired absence of other specified parts of digestive tract: Secondary | ICD-10-CM | POA: Diagnosis not present

## 2022-04-04 DIAGNOSIS — I700 Atherosclerosis of aorta: Secondary | ICD-10-CM | POA: Diagnosis not present

## 2022-04-04 MED ORDER — PREGABALIN 25 MG PO CAPS: 25.0000 mg | ORAL_CAPSULE | Freq: Two times a day (BID) | ORAL | 1 refills | Status: DC

## 2022-04-11 ENCOUNTER — Ambulatory Visit: Payer: Commercial Managed Care - PPO | Admitting: Internal Medicine

## 2022-04-25 ENCOUNTER — Ambulatory Visit: Payer: Commercial Managed Care - PPO | Admitting: Internal Medicine

## 2022-09-09 ENCOUNTER — Telehealth: Payer: Self-pay | Admitting: Internal Medicine

## 2022-09-10 ENCOUNTER — Ambulatory Visit (HOSPITAL_COMMUNITY): Payer: Medicaid Other

## 2022-09-12 ENCOUNTER — Telehealth: Payer: Self-pay | Admitting: *Deleted

## 2022-09-16 ENCOUNTER — Inpatient Hospital Stay: Payer: Medicaid Other

## 2022-09-16 ENCOUNTER — Inpatient Hospital Stay: Payer: Medicaid Other | Admitting: Physician Assistant

## 2022-09-17 ENCOUNTER — Encounter: Payer: Self-pay | Admitting: Medical Oncology

## 2022-09-20 ENCOUNTER — Telehealth: Payer: Self-pay | Admitting: Physician Assistant

## 2022-09-20 DIAGNOSIS — Z85118 Personal history of other malignant neoplasm of bronchus and lung: Secondary | ICD-10-CM | POA: Insufficient documentation

## 2022-09-20 DIAGNOSIS — J189 Pneumonia, unspecified organism: Secondary | ICD-10-CM | POA: Insufficient documentation

## 2022-09-20 NOTE — Telephone Encounter (Signed)
The patient is scheduled for follow-up with Dr. Arbutus Ped on 10/08/2022.  He CT scan is still not scheduled.  I called and left a voicemail with instructions on how to go about scheduling his repeat CT scan (with an estimated date of 10/03/2022).  I would like for him to call [phone] and speak with a radiology scheduler to re-schedule his scan prior to his follow-up with Dr. Arbutus Ped on 8/13. I left our callback number if he has any questions [phone]).

## 2022-09-26 ENCOUNTER — Ambulatory Visit (HOSPITAL_COMMUNITY): Payer: Medicaid Other

## 2022-10-03 ENCOUNTER — Inpatient Hospital Stay: Payer: Medicaid Other | Attending: Physician Assistant

## 2022-10-04 ENCOUNTER — Other Ambulatory Visit: Payer: Self-pay

## 2022-10-04 ENCOUNTER — Telehealth: Payer: Self-pay

## 2022-10-04 NOTE — Telephone Encounter (Signed)
This nurse reached out to patient related to scheduling his CT scan.  Left a message providing the phone number to central scheduling to schedule scan.  Advised patient that office visit in 8/13 will be cancelled and rescheduled for a date and time after the scan is completed.  No further questions or concerns noted at this time.

## 2022-10-07 ENCOUNTER — Ambulatory Visit: Payer: Commercial Managed Care - PPO | Admitting: Internal Medicine

## 2022-10-07 IMAGING — DX DG CHEST 1V PORT
1 series · 1 of 1 positions shown · non-contrast
Comparison: Yesterday

CLINICAL DATA: Follow-up of pneumothorax after left upper
lobectomy.

EXAM:
PORTABLE CHEST 1 VIEW

[chest]
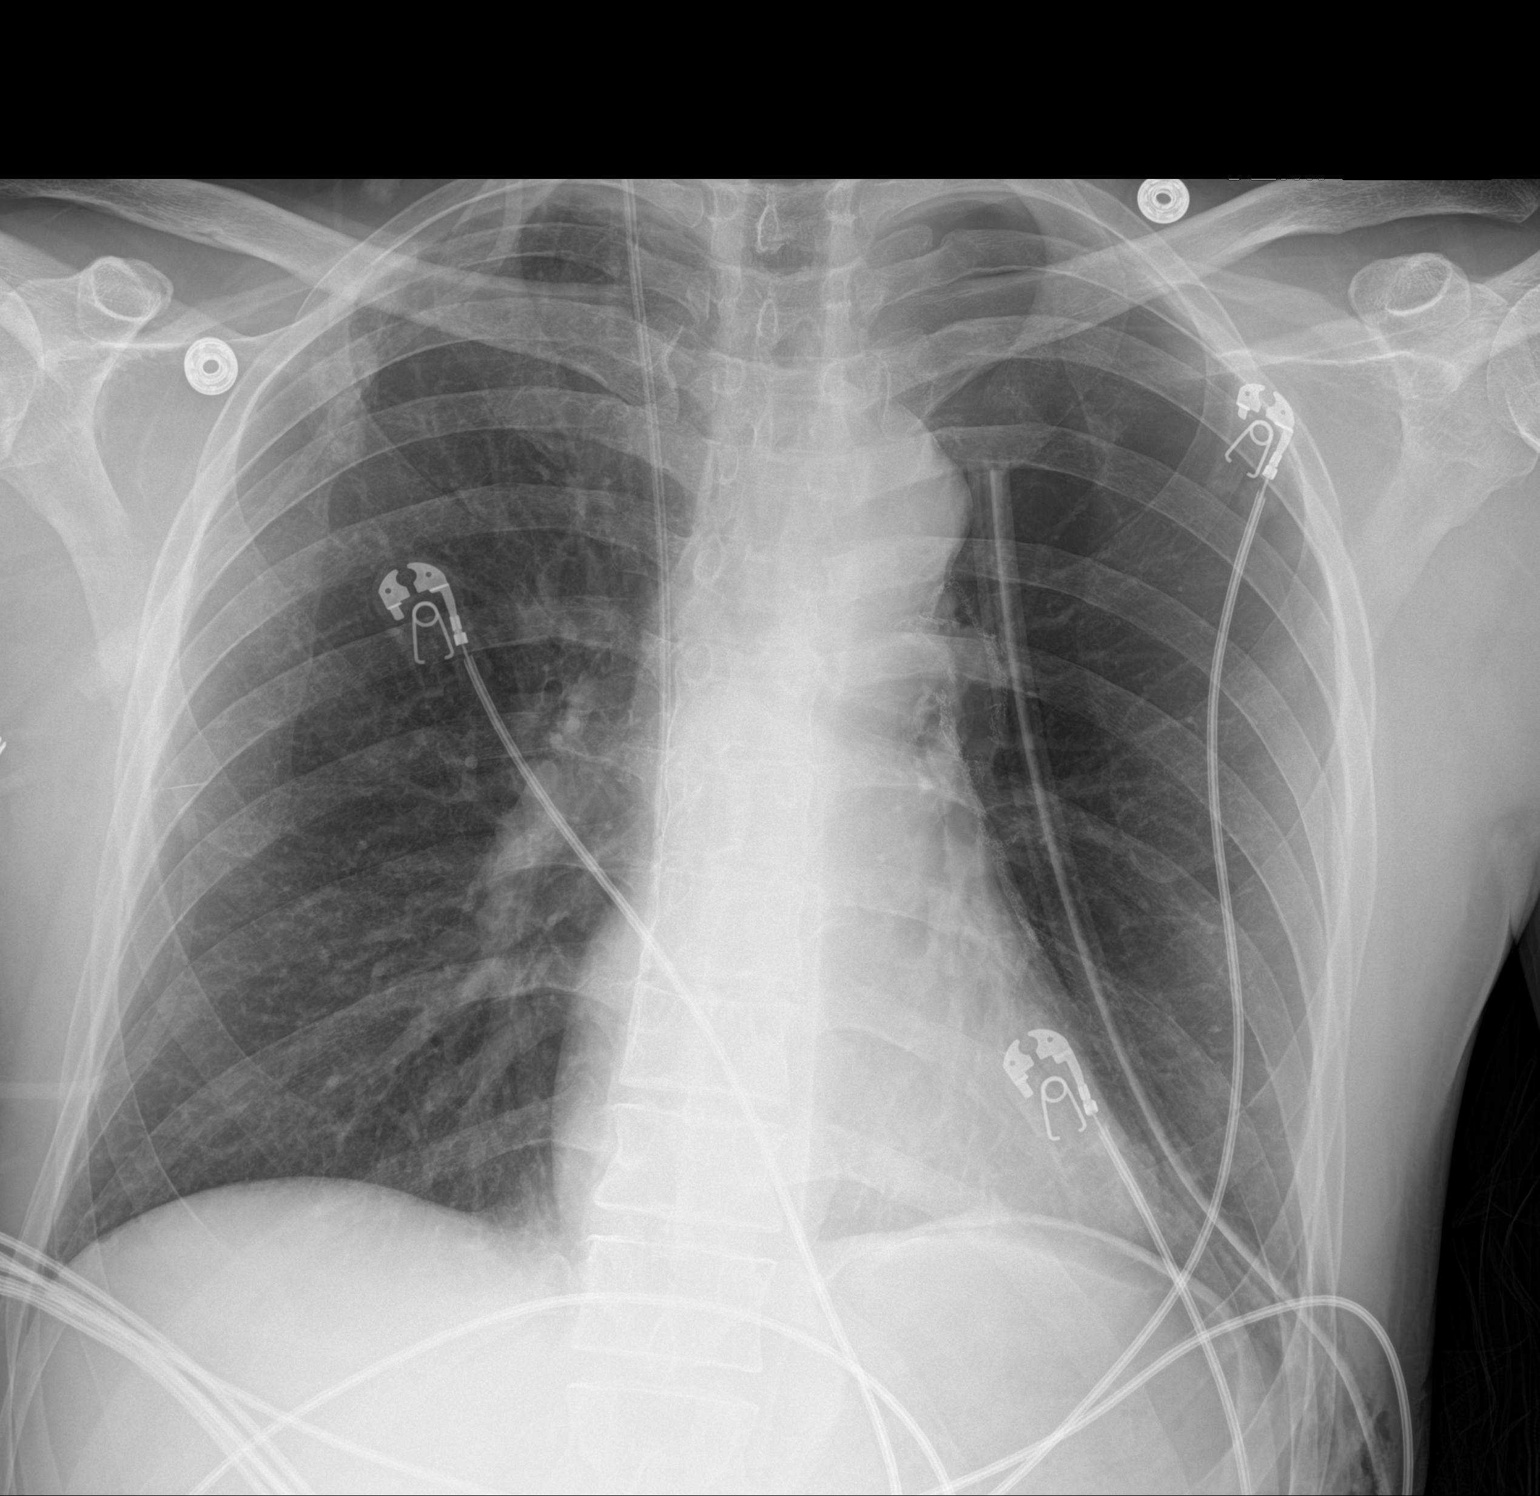

[1 of 1 positions shown; findings below may reference images not displayed]

FINDINGS: Hyperinflation. Right internal jugular line tip at mid right atrium.

Midline trachea. Normal heart size. Left chest tube is similar in
position. Left-sided pneumothorax is decreased. The visceral pleural
line is difficult to visualize at the apex, but identified 1.0 cm
from the chest wall laterally. Decreased from 1.6 cm on the prior
exam. Estimated at 20. There is likely also a medial component.

No lobar consolidation.
IMPRESSION: Left chest tube remaining in place with decrease in left-sided
pneumothorax.

Underlying hyperinflation, consistent with COPD. Emphysema
([id number]).

## 2022-10-08 ENCOUNTER — Ambulatory Visit: Payer: Commercial Managed Care - PPO | Admitting: Internal Medicine

## 2022-10-08 IMAGING — DX DG CHEST 1V
1 series · 1 of 1 positions shown · non-contrast
Comparison: Chest x-ray from earlier same day.

CLINICAL DATA: Follow-up status post chest tube clamping.

EXAM:
CHEST  1 VIEW

[chest]
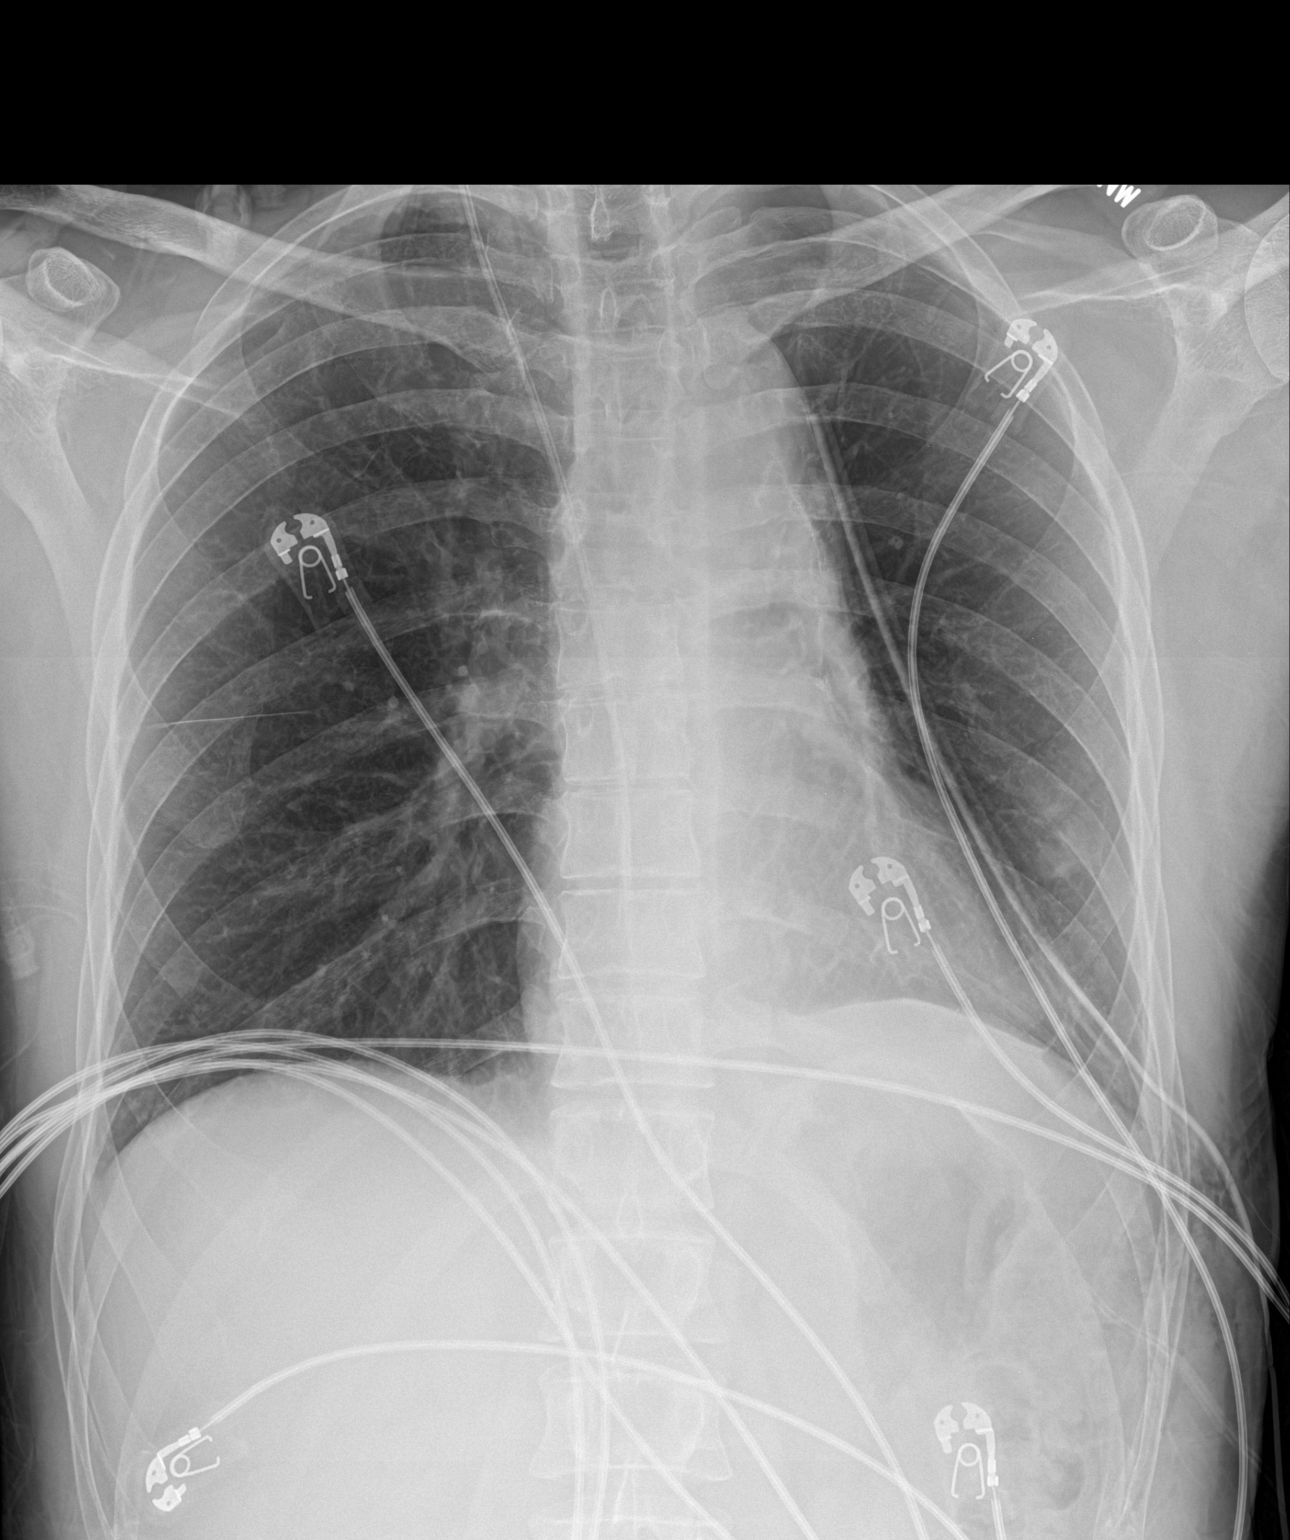

[1 of 1 positions shown; findings below may reference images not displayed]

FINDINGS: LEFT-sided chest tube is stable in position. Persistent small
pneumothorax at the LEFT lung apex, slightly decreased in size
compared to today's earlier chest x-ray. No new lung findings. Heart
size and mediastinal contours are stable.
IMPRESSION: Persistent small pneumothorax at the LEFT lung apex, slightly
decreased in size compared to today's earlier chest x-ray.

## 2022-10-08 IMAGING — DX DG CHEST 1V
1 series · 1 of 1 positions shown · non-contrast
Comparison: Chest x-ray dated 12/09/2020.

CLINICAL DATA: Status post LEFT upper lobectomy on 12/08/2020

EXAM:
CHEST  1 VIEW

[chest]
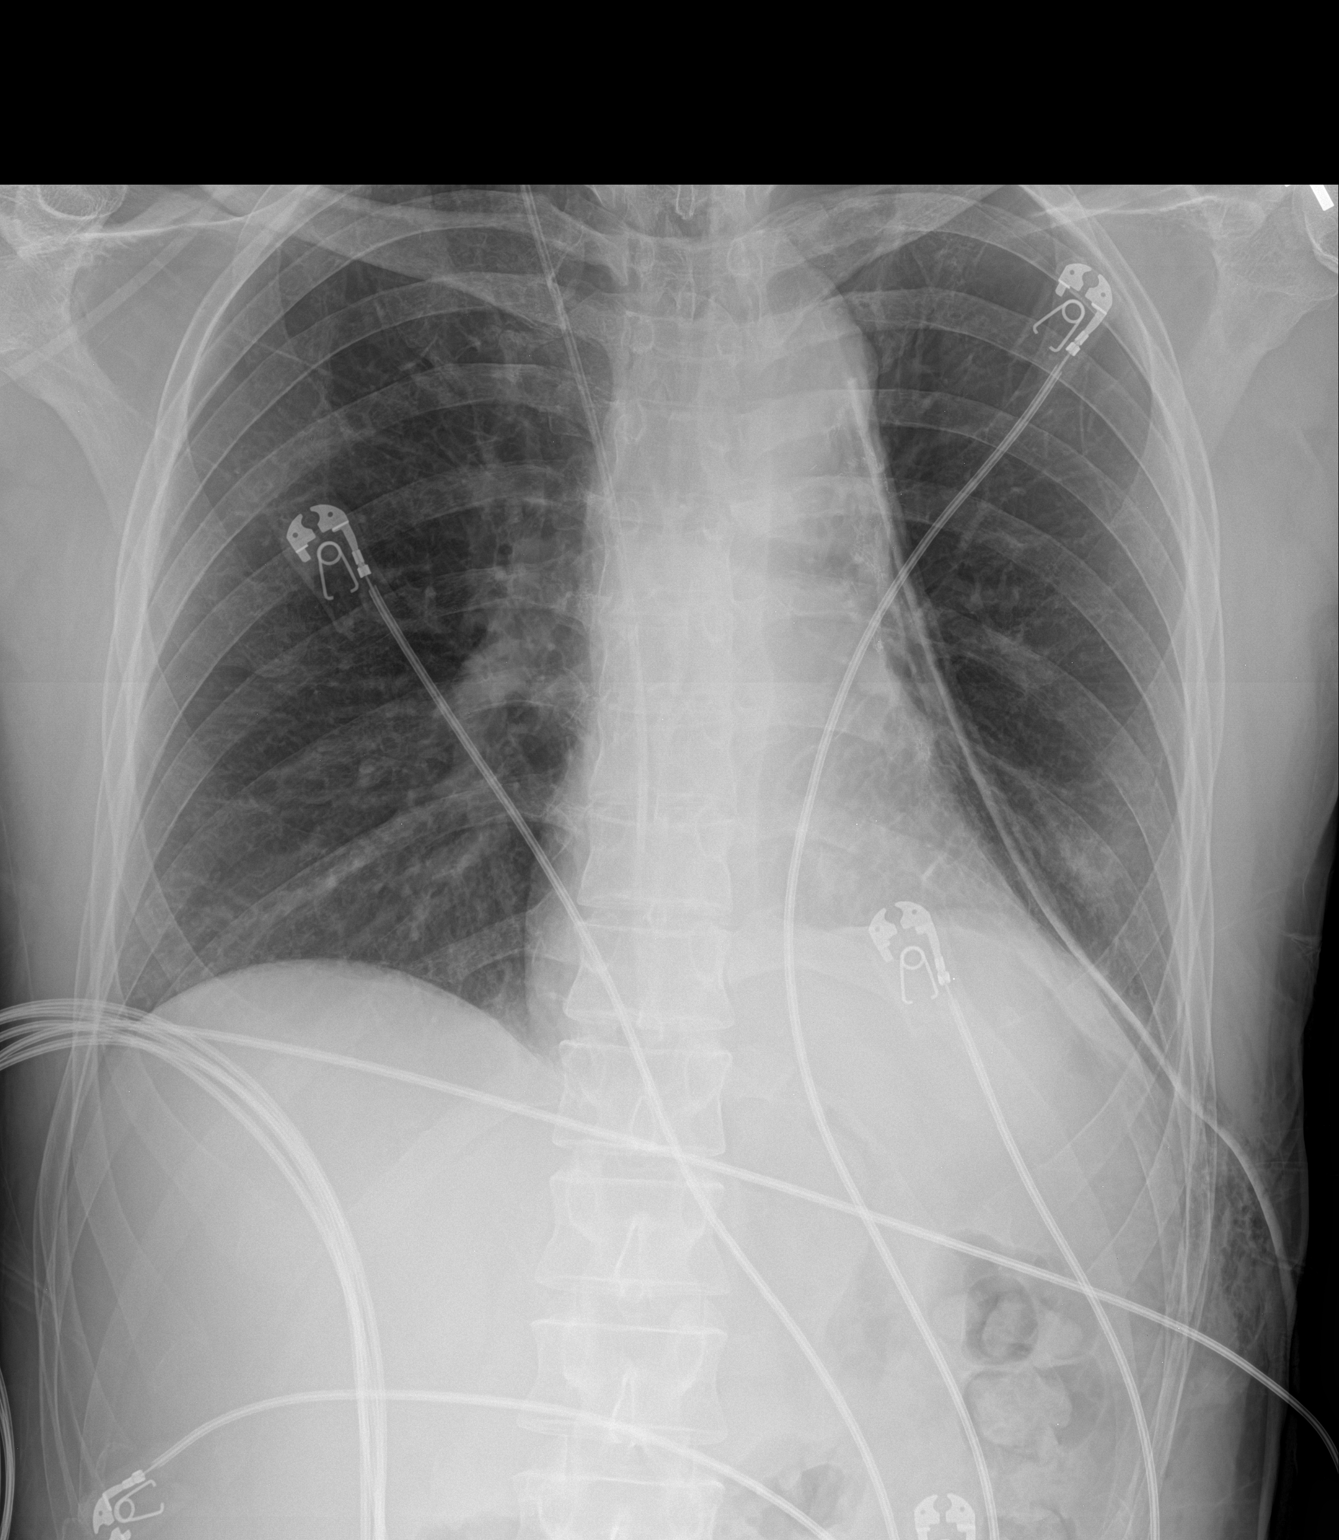

[1 of 1 positions shown; findings below may reference images not displayed]

FINDINGS: LEFT-sided chest tube is stable in position. Small to moderate
pneumothorax at the LEFT lung apex, slightly decreased in size
compared to yesterday's exam. Probable mild atelectasis at the LEFT
lung base. RIGHT lung is clear.

LEFT-sided chest tube is stable in position with tip directed
towards the medial aspect of the LEFT upper lobe. RIGHT IJ central
line is stable in position.
IMPRESSION: 1. Small to moderate pneumothorax at the LEFT lung apex, slightly
decreased in size compared to yesterday's exam. LEFT-sided chest
tube is stable in position.
2. Probable mild atelectasis at the LEFT lung base.
3. RIGHT lung is clear.

## 2022-10-09 IMAGING — CR DG CHEST 2V
2 series · 2 of 2 positions shown · non-contrast
Comparison: the previous day's study

CLINICAL DATA: Follow-up exam left pneumothorax

EXAM:
CHEST - 2 VIEW

[chest lat]
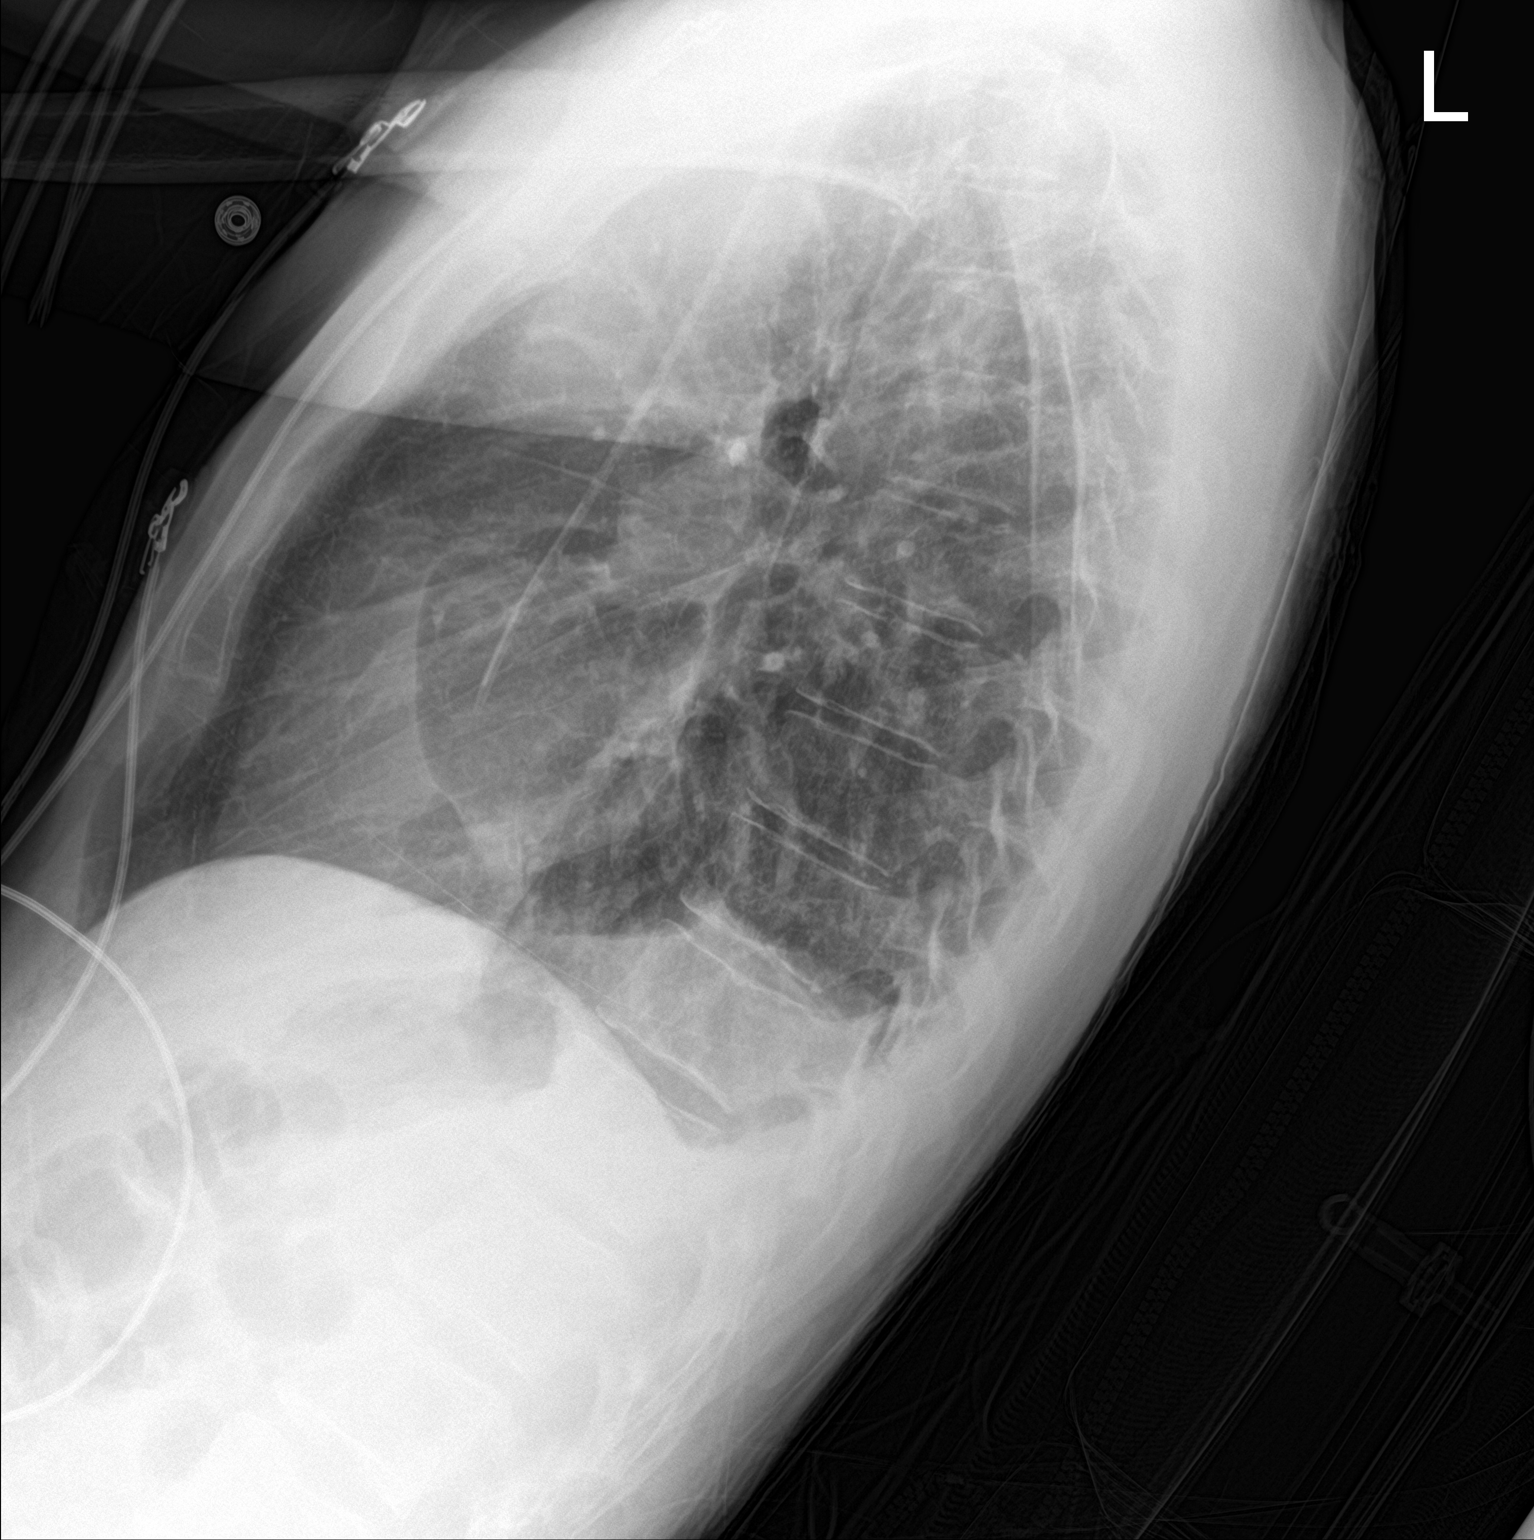

[chest ap]
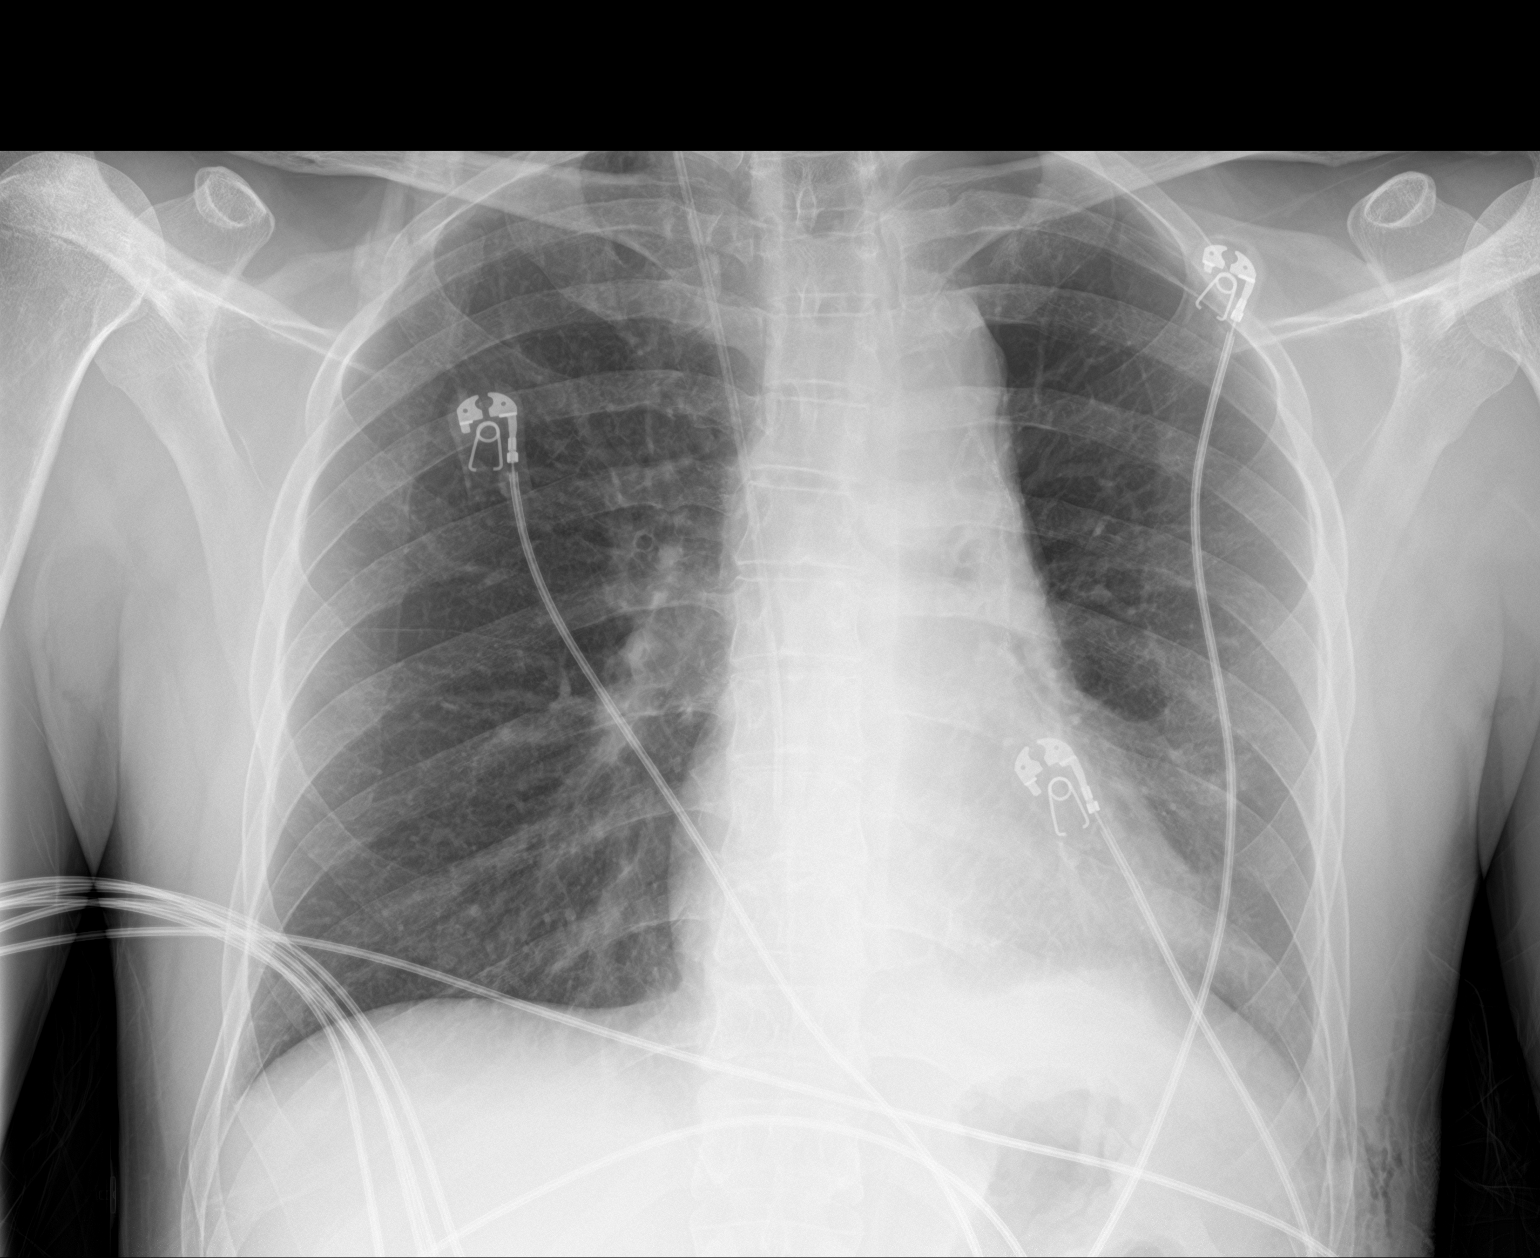

[2 of 2 positions shown; findings below may reference images not displayed]

FINDINGS: Small left apical pneumothorax, slightly decreased since previous,
apex projecting just below the posterior aspect left second rib.
Lungs remain clear.

Heart size and mediastinal contours are within normal limits.

Small bilateral pleural effusions. Right IJ central line to the
proximal right atrium.

Visualized bones unremarkable.
IMPRESSION: 1. Slight decrease in size of small left apical pneumothorax.

## 2022-10-23 ENCOUNTER — Telehealth: Payer: Self-pay | Admitting: Physician Assistant

## 2022-10-23 NOTE — Telephone Encounter (Signed)
I was finally able to reach this patient. He was supposed to follow up with [place] every 6 months (supposed to follow up in August 2024). I called him and gave him instructions to call the radiology to get this scheduled. Once this is scheduled, I will message my scheduling department to make a follow up visit about 1 week after his CT to review the results.

## 2022-10-25 ENCOUNTER — Telehealth: Payer: Self-pay | Admitting: Internal Medicine

## 2022-10-25 NOTE — Telephone Encounter (Signed)
Called patient regarding upcoming September appointments, left a voicemail. 

## 2022-10-31 ENCOUNTER — Ambulatory Visit (HOSPITAL_COMMUNITY): Payer: Medicaid Other | Attending: Physician Assistant

## 2022-11-01 ENCOUNTER — Other Ambulatory Visit: Payer: Self-pay | Admitting: Physician Assistant

## 2022-11-05 ENCOUNTER — Telehealth: Payer: Self-pay

## 2022-11-05 NOTE — Telephone Encounter (Signed)
Per C. Heilingoetter, PA, pt called to notify that his 11/07/22 appt with her is going to be cancelled due to him not having his CT scan done yet. Advised to call [phone] to set up CT scan and that an appointment w/ C. Heilingeotter, PA will need to be scheduled approx one week after the scan is done. He verbalizes understanding.

## 2022-11-07 ENCOUNTER — Ambulatory Visit: Payer: Medicaid Other | Admitting: Physician Assistant

## 2022-11-07 ENCOUNTER — Other Ambulatory Visit: Payer: Medicaid Other

## 2022-12-06 ENCOUNTER — Telehealth: Payer: Self-pay | Admitting: Physician Assistant

## 2022-12-06 NOTE — Telephone Encounter (Signed)
I was able to reach the patient.  He is overdue for his CT scan.  I have called him and gave him the number to radiology scheduling.  I instructed him to call them today and schedule his CT scan.  Once he has scheduled his CT scan, I will have my schedulers reach out to him to schedule a lab appointment and a follow-up visit 7 to 10 days after his CT scan in the office to review the results.

## 2022-12-07 ENCOUNTER — Telehealth: Payer: Self-pay | Admitting: Internal Medicine

## 2022-12-20 ENCOUNTER — Inpatient Hospital Stay: Payer: Medicaid Other | Attending: Physician Assistant

## 2022-12-20 ENCOUNTER — Ambulatory Visit (HOSPITAL_BASED_OUTPATIENT_CLINIC_OR_DEPARTMENT_OTHER)
Admission: RE | Admit: 2022-12-20 | Discharge: 2022-12-20 | Disposition: A | Discharge status: Home / Self Care | Payer: Medicaid Other | Source: Ambulatory Visit | Attending: Physician Assistant | Admitting: Physician Assistant

## 2022-12-20 DIAGNOSIS — C3492 Malignant neoplasm of unspecified part of left bronchus or lung: Secondary | ICD-10-CM

## 2022-12-20 DIAGNOSIS — C3412 Malignant neoplasm of upper lobe, left bronchus or lung: Secondary | ICD-10-CM | POA: Diagnosis present

## 2022-12-20 DIAGNOSIS — Z902 Acquired absence of lung [part of]: Secondary | ICD-10-CM | POA: Diagnosis not present

## 2022-12-20 DIAGNOSIS — Z79899 Other long term (current) drug therapy: Secondary | ICD-10-CM | POA: Insufficient documentation

## 2022-12-20 LAB — CMP (CANCER CENTER ONLY)
ALT: 7 U/L (ref 0–44)
AST: 27 U/L (ref 15–41)
Albumin: 4.5 g/dL (ref 3.5–5.0)
Alkaline Phosphatase: 58 U/L (ref 38–126)
Anion gap: 8 (ref 5–15)
BUN: 11 mg/dL (ref 6–20)
CO2: 29 mmol/L (ref 22–32)
Calcium: 9.5 mg/dL (ref 8.9–10.3)
Chloride: 102 mmol/L (ref 98–111)
Creatinine: 0.71 mg/dL (ref 0.61–1.24)
GFR, Estimated: >60 mL/min (ref >60)
Glucose, Bld: 96 mg/dL (ref 70–99)
Potassium: 3.8 mmol/L (ref 3.5–5.1)
Sodium: 139 mmol/L (ref 135–145)
Total Bilirubin: 0.8 mg/dL (ref 0.3–1.2)
Total Protein: 7.6 g/dL (ref 6.5–8.1)

## 2022-12-20 LAB — CBC WITH DIFFERENTIAL (CANCER CENTER ONLY)
Abs Immature Granulocytes: 0.01 10*3/uL (ref 0.00–0.07)
Basophils Absolute: 0.1 10*3/uL (ref 0.0–0.1)
Basophils Relative: 1 %
Eosinophils Absolute: 0 10*3/uL (ref 0.0–0.5)
Eosinophils Relative: 1 %
HCT: 42.9 % (ref 39.0–52.0)
Hemoglobin: 15 g/dL (ref 13.0–17.0)
Immature Granulocytes: 0 %
Lymphocytes Relative: 20 %
Lymphs Abs: 0.9 10*3/uL (ref 0.7–4.0)
MCH: 34.2 pg — ABNORMAL HIGH (ref 26.0–34.0)
MCHC: 35 g/dL (ref 30.0–36.0)
MCV: 97.9 fL (ref 80.0–100.0)
Monocytes Absolute: 0.5 10*3/uL (ref 0.1–1.0)
Monocytes Relative: 10 %
Neutro Abs: 3 10*3/uL (ref 1.7–7.7)
Neutrophils Relative %: 68 %
Platelet Count: 237 10*3/uL (ref 150–400)
RBC: 4.38 MIL/uL (ref 4.22–5.81)
RDW: 12.3 % (ref 11.5–15.5)
WBC Count: 4.5 10*3/uL (ref 4.0–10.5)
nRBC: 0 % (ref 0.0–0.2)

## 2022-12-20 MED ORDER — IOHEXOL 300 MG/ML  SOLN
75.0000 mL | Freq: Once | INTRAMUSCULAR | Status: AC | PRN
  Administered 2022-12-20: 75 mL via INTRAVENOUS

## 2022-12-24 NOTE — Progress Notes (Deleted)
[facility] OFFICE PROGRESS NOTE  Pcp, No No address on file  DIAGNOSIS: Stage Ib (T2 a, N0, M0 )non-small cell lung cancer, adenocarcinoma.  The patient presented with a left upper lobe pulmonary nodule.  He was diagnosed in August 2022.   PRIOR THERAPY:  1) robotic assisted thorascopy left upper lobectomy under the care of Dr. Dorris Fetch on 12/08/2020   CURRENT THERAPY: Observation   INTERVAL HISTORY: Alex Sims 47 y.o. male returns  to the clinic today for a 22-month follow-up visit.  The patient was last seen by Dr. Arbutus Ped and myself on 04/04/22. There was been some trouble with compliance with appointments and difficulty getting in touch with the patient. He is currently on observation for his history of lung cancer status post resection in October 2022.  He is feeling fairly well today.  Denies any major changes in his health.  Denies any fever, chills, night sweats, or unexplained weight loss.   He has had night sweats in the past but none recently.  He reports sometimes his left chest feels sore since his surgery.  He never picked up the Lyrica that Dr. Dorris Fetch had sent him.  He reports ever since his surgery he has had baseline dyspnea on exertion and cough.  Denies any recent episodes of hemoptysis since he was seen in the emergency room in November.  Denies any nausea, vomiting, diarrhea, or constipation.  Denies any headache or visual changes.  The patient quit smoking when he was diagnosed with lung cancer.  The patient is currently on observation after having a left upper lobectomy under the care of Dr. Dorris Fetch in October 2022.  He is here today to review his scan results and for a more detailed discussion about his current condition and recommended treatment options.    MEDICAL HISTORY: Past Medical History:  Diagnosis Date   Adenocarcinoma of left lung, stage 1 (HCC)     ALLERGIES:  has No Known Allergies.  MEDICATIONS:  Current Outpatient  Medications  Medication Sig Dispense Refill   albuterol (VENTOLIN HFA) 108 (90 Base) MCG/ACT inhaler Inhale 2 puffs into the lungs every 6 (six) hours as needed for wheezing or shortness of breath.     ondansetron (ZOFRAN) 4 MG tablet Take 1 tablet (4 mg total) by mouth every 8 (eight) hours as needed for nausea or vomiting. 15 tablet 0   pregabalin (LYRICA) 25 MG capsule Take 1 capsule (25 mg total) by mouth 2 (two) times daily. 60 capsule 1   No current facility-administered medications for this visit.    SURGICAL HISTORY:  Past Surgical History:  Procedure Laterality Date   APPENDECTOMY     BRONCHIAL BIOPSY  10/02/2020   Procedure: BRONCHIAL BIOPSIES;  Surgeon: Leslye Peer, MD;  Location: [facility] ENDOSCOPY;  Service: Pulmonary;;   BRONCHIAL BRUSHINGS  10/02/2020   Procedure: BRONCHIAL BRUSHINGS;  Surgeon: Leslye Peer, MD;  Location: [facility] ENDOSCOPY;  Service: Pulmonary;;   BRONCHIAL NEEDLE ASPIRATION BIOPSY  10/02/2020   Procedure: BRONCHIAL NEEDLE ASPIRATION BIOPSIES;  Surgeon: Leslye Peer, MD;  Location: [facility] ENDOSCOPY;  Service: Pulmonary;;   BRONCHIAL WASHINGS  10/02/2020   Procedure: BRONCHIAL WASHINGS;  Surgeon: Leslye Peer, MD;  Location: [facility] ENDOSCOPY;  Service: Pulmonary;;   INTERCOSTAL NERVE BLOCK Left 12/08/2020   Procedure: INTERCOSTAL NERVE BLOCK;  Surgeon: Loreli Slot, MD;  Location: [facility] OR;  Service: Thoracic;  Laterality: Left;   NODE DISSECTION Left 12/08/2020   Procedure: NODE DISSECTION;  Surgeon: Charlett Lango  C, MD;  Location: [facility] OR;  Service: Thoracic;  Laterality: Left;   VIDEO BRONCHOSCOPY WITH ENDOBRONCHIAL NAVIGATION N/A 10/02/2020   Procedure: ROBOTIC BRONCHOSCOPY WITH ENDOBRONCHIAL NAVIGATION;  Surgeon: Leslye Peer, MD;  Location: [facility] ENDOSCOPY;  Service: Pulmonary;  Laterality: N/A;   VIDEO BRONCHOSCOPY WITH RADIAL ENDOBRONCHIAL ULTRASOUND  10/02/2020   Procedure: RADIAL ENDOBRONCHIAL ULTRASOUND;  Surgeon: Leslye Peer, MD;  Location: [facility]  ENDOSCOPY;  Service: Pulmonary;;    REVIEW OF SYSTEMS:   Review of Systems  Constitutional: Negative for appetite change, chills, fatigue, fever and unexpected weight change.  HENT:   Negative for mouth sores, nosebleeds, sore throat and trouble swallowing.   Eyes: Negative for eye problems and icterus.  Respiratory: Negative for cough, hemoptysis, shortness of breath and wheezing.   Cardiovascular: Negative for chest pain and leg swelling.  Gastrointestinal: Negative for abdominal pain, constipation, diarrhea, nausea and vomiting.  Genitourinary: Negative for bladder incontinence, difficulty urinating, dysuria, frequency and hematuria.   Musculoskeletal: Negative for back pain, gait problem, neck pain and neck stiffness.  Skin: Negative for itching and rash.  Neurological: Negative for dizziness, extremity weakness, gait problem, headaches, light-headedness and seizures.  Hematological: Negative for adenopathy. Does not bruise/bleed easily.  Psychiatric/Behavioral: Negative for confusion, depression and sleep disturbance. The patient is not nervous/anxious.     PHYSICAL EXAMINATION:  There were no vitals taken for this visit.  ECOG PERFORMANCE STATUS: {CHL ONC ECOG [id number]  Physical Exam  Constitutional: Oriented to person, place, and time and well-developed, well-nourished, and in no distress. No distress.  HENT:  Head: Normocephalic and atraumatic.  Mouth/Throat: Oropharynx is clear and moist. No oropharyngeal exudate.  Eyes: Conjunctivae are normal. Right eye exhibits no discharge. Left eye exhibits no discharge. No scleral icterus.  Neck: Normal range of motion. Neck supple.  Cardiovascular: Normal rate, regular rhythm, normal heart sounds and intact distal pulses.   Pulmonary/Chest: Effort normal and breath sounds normal. No respiratory distress. No wheezes. No rales.  Abdominal: Soft. Bowel sounds are normal. Exhibits no distension and no mass. There is no tenderness.   Musculoskeletal: Normal range of motion. Exhibits no edema.  Lymphadenopathy:    No cervical adenopathy.  Neurological: Alert and oriented to person, place, and time. Exhibits normal muscle tone. Gait normal. Coordination normal.  Skin: Skin is warm and dry. No rash noted. Not diaphoretic. No erythema. No pallor.  Psychiatric: Mood, memory and judgment normal.  Vitals reviewed.  LABORATORY DATA: Lab Results  Component Value Date   WBC 4.5 12/20/2022   HGB 15.0 12/20/2022   HCT 42.9 12/20/2022   MCV 97.9 12/20/2022   PLT 237 12/20/2022      Chemistry      Component Value Date/Time   NA 139 12/20/2022 1525   K 3.8 12/20/2022 1525   CL 102 12/20/2022 1525   CO2 29 12/20/2022 1525   BUN 11 12/20/2022 1525   CREATININE 0.71 12/20/2022 1525      Component Value Date/Time   CALCIUM 9.5 12/20/2022 1525   ALKPHOS 58 12/20/2022 1525   AST 27 12/20/2022 1525   ALT 7 12/20/2022 1525   BILITOT 0.8 12/20/2022 1525       RADIOGRAPHIC STUDIES:  No results found.   ASSESSMENT/PLAN:  This is a very pleasant 47 year old Caucasian male diagnosed with stage Ib (T2 a, N0, M0) small cell lung cancer, adenocarcinoma.  The patient presented with a left upper lobe lung mass.  He was diagnosed in August 2022.   The patient is status  post left upper lobectomy under the care of Dr. Dorris Fetch which was performed on 12/08/2020. The patient had 13 negative lymph nodes. There was visceral pleural involvement.    The patient is on observation.  The patient recently had a restaging CT scan performed.  Dr. Arbutus Ped personally and independently reviewed the scan results.  The scan showed ***  Dr. Arbutus Ped recommends that the patient continue on observation with a restaging CT scan of the chest in 6 months.  We will see him back for follow-up visit approximately 7 to 10 days after his CT scan to review the results in the office.  The patient was advised to call immediately if he has any concerning  symptoms in the interval. The patient voices understanding of current disease status and treatment options and is in agreement with the current care plan. All questions were answered. The patient knows to call the clinic with any problems, questions or concerns. We can certainly see the patient much sooner if necessary   No orders of the defined types were placed in this encounter.    I spent {CHL ONC TIME VISIT - ZOXWR:[id number]} counseling the patient face to face. The total time spent in the appointment was {CHL ONC TIME VISIT - BJYNW:[id number]}.  Talyia Allende L Alexius Ellington, PA-C 12/24/22

## 2022-12-30 ENCOUNTER — Inpatient Hospital Stay: Payer: Medicaid Other | Attending: Physician Assistant | Admitting: Physician Assistant

## 2022-12-30 NOTE — Telephone Encounter (Signed)
This patient has been very challenging to show up for his scans and appointments.  I talked to the patient today.  He was scheduled for an appointment today to review his recent scan.  He forgot about his appointment.  However, he also informed me that he recently lost his job and finances are tight.  He is wondering what his out-of-pocket cost would be to reschedule his office visit whether in person or virtual.  I am unsure what the out-of-pocket cost is,  but I have reached out to the billing team to find out.  The patient would like to know what the out-of-pocket cost is prior to rescheduling his appointment.  I also gave him our callback number if he changes his mind about rescheduling an appointment.

## 2023-03-03 ENCOUNTER — Inpatient Hospital Stay: Payer: Medicaid Other | Attending: Physician Assistant | Admitting: Internal Medicine

## 2023-03-03 ENCOUNTER — Telehealth: Payer: Self-pay | Admitting: Medical Oncology

## 2023-03-03 DIAGNOSIS — C3492 Malignant neoplasm of unspecified part of left bronchus or lung: Secondary | ICD-10-CM

## 2023-03-03 NOTE — Progress Notes (Signed)
 No show

## 2023-03-03 NOTE — Telephone Encounter (Signed)
 Mother said he has odd working hours. I told her Dr. Arbutus Ped called to review his scan results.  If she gets in touch with him she will tell Vince  to call Dr. Asa Lente office.

## 2023-03-12 ENCOUNTER — Telehealth: Payer: Self-pay | Admitting: Medical Oncology

## 2023-03-12 NOTE — Telephone Encounter (Signed)
 Calling back to get an appt. He is unemployed and does not have transportation .Pt scheduled for a phone visit tomorrow. He confirmed.

## 2023-03-13 ENCOUNTER — Inpatient Hospital Stay (HOSPITAL_BASED_OUTPATIENT_CLINIC_OR_DEPARTMENT_OTHER): Payer: Medicaid Other | Admitting: Internal Medicine

## 2023-03-13 DIAGNOSIS — C3490 Malignant neoplasm of unspecified part of unspecified bronchus or lung: Secondary | ICD-10-CM

## 2023-03-13 NOTE — Progress Notes (Signed)
[facility] [phone]   [phone]) [phone]  PROGRESS NOTE FOR TELEMEDICINE VISITS  Pcp, No No address on file  I connected [email or web address] on 03/13/23 at  8:45 AM EST by telephone visit and verified that I am speaking with the correct person using two identifiers.   I discussed the limitations, risks, security and privacy concerns of performing an evaluation and management service by telemedicine and the availability of in-person appointments. I also discussed with the patient that there may be a patient responsible charge related to this service. The patient expressed understanding and agreed to proceed.  Other persons participating in the visit and their role in the encounter:  wife  Patient's location: Home Provider's location: [facility]  DIAGNOSIS: Stage Ib (T2 a, N0, M0 )non-small cell lung cancer, adenocarcinoma.  The patient presented with a left upper lobe pulmonary nodule.  He was diagnosed in August 2022.    PRIOR THERAPY: 1) robotic assisted thorascopy left upper lobectomy under the care of Dr. Dorris Fetch on 12/08/2020    CURRENT THERAPY: Observation   INTERVAL HISTORY: Alex Sims 48 y.o. male has a telephone virtual visit with me today for evaluation and discussion of his scan results.  The patient is feeling fine except for persistent cough productive of greenish sputum started a month ago.  He denied having any associated fever or chills.  He has no chest pain or shortness of breath and no hemoptysis.  He has no recent weight loss or night sweats.  He has no headache or visual changes.  The patient denied having any significant nausea, vomiting, diarrhea or constipation.  He had CT scan of the chest performed on December 20, 2022 but he did not show up for evaluation of his scan since that time.  MEDICAL HISTORY: Past Medical History:  Diagnosis Date   Adenocarcinoma of left lung, stage 1 (HCC)     ALLERGIES:  has no known  allergies.  MEDICATIONS:  Current Outpatient Medications  Medication Sig Dispense Refill   albuterol (VENTOLIN HFA) 108 (90 Base) MCG/ACT inhaler Inhale 2 puffs into the lungs every 6 (six) hours as needed for wheezing or shortness of breath.     ondansetron (ZOFRAN) 4 MG tablet Take 1 tablet (4 mg total) by mouth every 8 (eight) hours as needed for nausea or vomiting. 15 tablet 0   pregabalin (LYRICA) 25 MG capsule Take 1 capsule (25 mg total) by mouth 2 (two) times daily. 60 capsule 1   No current facility-administered medications for this visit.    SURGICAL HISTORY:  Past Surgical History:  Procedure Laterality Date   APPENDECTOMY     BRONCHIAL BIOPSY  10/02/2020   Procedure: BRONCHIAL BIOPSIES;  Surgeon: Leslye Peer, MD;  Location: [facility] ENDOSCOPY;  Service: Pulmonary;;   BRONCHIAL BRUSHINGS  10/02/2020   Procedure: BRONCHIAL BRUSHINGS;  Surgeon: Leslye Peer, MD;  Location: [facility] ENDOSCOPY;  Service: Pulmonary;;   BRONCHIAL NEEDLE ASPIRATION BIOPSY  10/02/2020   Procedure: BRONCHIAL NEEDLE ASPIRATION BIOPSIES;  Surgeon: Leslye Peer, MD;  Location: [facility] ENDOSCOPY;  Service: Pulmonary;;   BRONCHIAL WASHINGS  10/02/2020   Procedure: BRONCHIAL WASHINGS;  Surgeon: Leslye Peer, MD;  Location: [facility] ENDOSCOPY;  Service: Pulmonary;;   INTERCOSTAL NERVE BLOCK Left 12/08/2020   Procedure: INTERCOSTAL NERVE BLOCK;  Surgeon: Loreli Slot, MD;  Location: [facility] OR;  Service: Thoracic;  Laterality: Left;   NODE DISSECTION Left 12/08/2020   Procedure: NODE DISSECTION;  Surgeon: Loreli Slot, MD;  Location: [facility] OR;  Service: Thoracic;  Laterality: Left;   VIDEO BRONCHOSCOPY WITH ENDOBRONCHIAL NAVIGATION N/A 10/02/2020   Procedure: ROBOTIC BRONCHOSCOPY WITH ENDOBRONCHIAL NAVIGATION;  Surgeon: Leslye Peer, MD;  Location: [facility] ENDOSCOPY;  Service: Pulmonary;  Laterality: N/A;   VIDEO BRONCHOSCOPY WITH RADIAL ENDOBRONCHIAL ULTRASOUND  10/02/2020   Procedure: RADIAL ENDOBRONCHIAL ULTRASOUND;   Surgeon: Leslye Peer, MD;  Location: [facility] ENDOSCOPY;  Service: Pulmonary;;    REVIEW OF SYSTEMS:  A comprehensive review of systems was negative except for: Respiratory: positive for cough and sputum    LABORATORY DATA: Lab Results  Component Value Date   WBC 4.5 12/20/2022   HGB 15.0 12/20/2022   HCT 42.9 12/20/2022   MCV 97.9 12/20/2022   PLT 237 12/20/2022      Chemistry      Component Value Date/Time   NA 139 12/20/2022 1525   K 3.8 12/20/2022 1525   CL 102 12/20/2022 1525   CO2 29 12/20/2022 1525   BUN 11 12/20/2022 1525   CREATININE 0.71 12/20/2022 1525      Component Value Date/Time   CALCIUM 9.5 12/20/2022 1525   ALKPHOS 58 12/20/2022 1525   AST 27 12/20/2022 1525   ALT 7 12/20/2022 1525   BILITOT 0.8 12/20/2022 1525       RADIOGRAPHIC STUDIES: No results found.  ASSESSMENT AND PLAN: This is a very pleasant 48 years old white male with history of stage Ib non-small cell lung cancer, adenocarcinoma status post left upper lobectomy under the care of Dr. Dorris Fetch in October 2022 and the patient has been in observation since that time.  The patient is currently on observation He had repeat CT scan of the chest on December 20, 2022 that showed new 1.9 x 1.3 cm irregular nodular opacity in the right lower lobe associated with areas of bronchovascular ground glass nodularity suspicious for infectious/inflammatory but follow-up scan in 3 months was recommended.  I discussed the scan results with the patient and his wife and recommended for him to have repeat CT scan of the chest in 2 weeks.  He will come back for follow-up visit either in person or virtually a week after his scan. The patient was advised to call immediately if he has any other concerning symptoms in the interval. I discussed the assessment and treatment plan with the patient. The patient was provided an opportunity to ask questions and all were answered. The patient agreed with the plan and demonstrated  an understanding of the instructions.   The patient was advised to call back or seek an in-person evaluation if the symptoms worsen or if the condition fails to improve as anticipated.  I provided 20 minutes of non face-to-face telephone visit time during this encounter, and > 50% was spent counseling as documented under my assessment & plan.  Lajuana Matte, MD 03/13/2023 8:44 AM  Disclaimer: This note was dictated with voice recognition software. Similar sounding words can inadvertently be transcribed and may not be corrected upon review.

## 2023-03-14 ENCOUNTER — Telehealth: Payer: Self-pay | Admitting: Internal Medicine

## 2023-03-27 ENCOUNTER — Inpatient Hospital Stay: Payer: Medicaid Other

## 2023-04-03 ENCOUNTER — Inpatient Hospital Stay: Payer: Medicaid Other | Attending: Physician Assistant | Admitting: Internal Medicine

## 2023-04-30 ENCOUNTER — Other Ambulatory Visit: Payer: Self-pay

## 2023-04-30 ENCOUNTER — Emergency Department (HOSPITAL_COMMUNITY)

## 2023-04-30 ENCOUNTER — Encounter (HOSPITAL_COMMUNITY): Payer: Self-pay

## 2023-04-30 ENCOUNTER — Emergency Department (HOSPITAL_COMMUNITY)
Admission: EM | Admit: 2023-04-30 | Discharge: 2023-04-30 | Discharge status: Against Medical Advice | Attending: Emergency Medicine | Admitting: Emergency Medicine

## 2023-04-30 DIAGNOSIS — Z85118 Personal history of other malignant neoplasm of bronchus and lung: Secondary | ICD-10-CM | POA: Diagnosis not present

## 2023-04-30 DIAGNOSIS — Z5321 Procedure and treatment not carried out due to patient leaving prior to being seen by health care provider: Secondary | ICD-10-CM | POA: Insufficient documentation

## 2023-04-30 DIAGNOSIS — R059 Cough, unspecified: Secondary | ICD-10-CM | POA: Insufficient documentation

## 2023-04-30 DIAGNOSIS — F1090 Alcohol use, unspecified, uncomplicated: Secondary | ICD-10-CM | POA: Diagnosis not present

## 2023-04-30 DIAGNOSIS — R079 Chest pain, unspecified: Secondary | ICD-10-CM | POA: Diagnosis present

## 2023-04-30 NOTE — ED Notes (Addendum)
 Pt was given his daughter's discharge papers with school excuse and says he is going to leave and f/u with his oncologist as it is getting late and has to get daughter to school tomorrow. Tammy, PA made aware

## 2023-04-30 NOTE — ED Triage Notes (Addendum)
 Pt reports chest pain that started back again about two weeks ago with productive cough, pt has hx of adenocarcinoma of left lung in 2022 in which pt had partial lung removal. Pt says it feels like the pain he had when he was diagnose in 2022. Pt is here with his daughter and she is checked in to be seen also. Pt endorses ETOH consumption, says he has consumed "about a pint more or less"

## 2023-05-01 LAB — BASIC METABOLIC PANEL
Anion gap: 11 (ref 5–15)
BUN: 10 mg/dL (ref 6–20)
CO2: 26 mmol/L (ref 22–32)
Calcium: 8.7 mg/dL — ABNORMAL LOW (ref 8.9–10.3)
Chloride: 105 mmol/L (ref 98–111)
Creatinine, Ser: 0.66 mg/dL (ref 0.61–1.24)
GFR, Estimated: >60 mL/min (ref >60)
Glucose, Bld: 99 mg/dL (ref 70–99)
Potassium: 3 mmol/L — ABNORMAL LOW (ref 3.5–5.1)
Sodium: 142 mmol/L (ref 135–145)

## 2023-05-01 LAB — TROPONIN I (HIGH SENSITIVITY): Troponin I (High Sensitivity): <2 ng/L (ref <18)

## 2023-05-01 LAB — CBC
HCT: 43.5 % (ref 39.0–52.0)
Hemoglobin: 14.6 g/dL (ref 13.0–17.0)
MCH: 33.6 pg (ref 26.0–34.0)
MCHC: 33.6 g/dL (ref 30.0–36.0)
MCV: 100.2 fL — ABNORMAL HIGH (ref 80.0–100.0)
Platelets: 247 10*3/uL (ref 150–400)
RBC: 4.34 MIL/uL (ref 4.22–5.81)
RDW: 12.6 % (ref 11.5–15.5)
WBC: 5.3 10*3/uL (ref 4.0–10.5)
nRBC: 0 % (ref 0.0–0.2)

## 2023-05-02 ENCOUNTER — Telehealth: Payer: Self-pay | Admitting: Physician Assistant

## 2023-05-02 NOTE — Telephone Encounter (Signed)
 Scheduled appointments around a CT scan. Left the patient a voicemail with the appointment details.

## 2023-05-06 ENCOUNTER — Ambulatory Visit (HOSPITAL_COMMUNITY)
Admission: RE | Admit: 2023-05-06 | Discharge: 2023-05-06 | Disposition: A | Discharge status: Home / Self Care | Source: Ambulatory Visit | Attending: Internal Medicine | Admitting: Internal Medicine

## 2023-05-06 ENCOUNTER — Inpatient Hospital Stay: Attending: Physician Assistant

## 2023-05-06 DIAGNOSIS — R053 Chronic cough: Secondary | ICD-10-CM | POA: Insufficient documentation

## 2023-05-06 DIAGNOSIS — Z79899 Other long term (current) drug therapy: Secondary | ICD-10-CM | POA: Insufficient documentation

## 2023-05-06 DIAGNOSIS — Z902 Acquired absence of lung [part of]: Secondary | ICD-10-CM | POA: Diagnosis not present

## 2023-05-06 DIAGNOSIS — R222 Localized swelling, mass and lump, trunk: Secondary | ICD-10-CM | POA: Diagnosis not present

## 2023-05-06 DIAGNOSIS — R079 Chest pain, unspecified: Secondary | ICD-10-CM | POA: Insufficient documentation

## 2023-05-06 DIAGNOSIS — C3490 Malignant neoplasm of unspecified part of unspecified bronchus or lung: Secondary | ICD-10-CM | POA: Insufficient documentation

## 2023-05-06 DIAGNOSIS — N6320 Unspecified lump in the left breast, unspecified quadrant: Secondary | ICD-10-CM | POA: Diagnosis not present

## 2023-05-06 DIAGNOSIS — Z9049 Acquired absence of other specified parts of digestive tract: Secondary | ICD-10-CM | POA: Insufficient documentation

## 2023-05-06 DIAGNOSIS — R232 Flushing: Secondary | ICD-10-CM | POA: Insufficient documentation

## 2023-05-06 DIAGNOSIS — R531 Weakness: Secondary | ICD-10-CM | POA: Insufficient documentation

## 2023-05-06 DIAGNOSIS — C3412 Malignant neoplasm of upper lobe, left bronchus or lung: Secondary | ICD-10-CM | POA: Diagnosis present

## 2023-05-06 LAB — CMP (CANCER CENTER ONLY)
ALT: 12 U/L (ref 0–44)
AST: 68 U/L — ABNORMAL HIGH (ref 15–41)
Albumin: 4.3 g/dL (ref 3.5–5.0)
Alkaline Phosphatase: 87 U/L (ref 38–126)
Anion gap: 5 (ref 5–15)
BUN: 12 mg/dL (ref 6–20)
CO2: 33 mmol/L — ABNORMAL HIGH (ref 22–32)
Calcium: 8.6 mg/dL — ABNORMAL LOW (ref 8.9–10.3)
Chloride: 101 mmol/L (ref 98–111)
Creatinine: 0.72 mg/dL (ref 0.61–1.24)
GFR, Estimated: >60 mL/min (ref >60)
Glucose, Bld: 96 mg/dL (ref 70–99)
Potassium: 3.5 mmol/L (ref 3.5–5.1)
Sodium: 139 mmol/L (ref 135–145)
Total Bilirubin: 0.8 mg/dL (ref 0.0–1.2)
Total Protein: 7.4 g/dL (ref 6.5–8.1)

## 2023-05-06 LAB — CBC WITH DIFFERENTIAL (CANCER CENTER ONLY)
Abs Immature Granulocytes: 0.02 10*3/uL (ref 0.00–0.07)
Basophils Absolute: 0.1 10*3/uL (ref 0.0–0.1)
Basophils Relative: 1 %
Eosinophils Absolute: 0.1 10*3/uL (ref 0.0–0.5)
Eosinophils Relative: 1 %
HCT: 40.9 % (ref 39.0–52.0)
Hemoglobin: 14.3 g/dL (ref 13.0–17.0)
Immature Granulocytes: 0 %
Lymphocytes Relative: 15 %
Lymphs Abs: 0.8 10*3/uL (ref 0.7–4.0)
MCH: 34 pg (ref 26.0–34.0)
MCHC: 35 g/dL (ref 30.0–36.0)
MCV: 97.1 fL (ref 80.0–100.0)
Monocytes Absolute: 0.6 10*3/uL (ref 0.1–1.0)
Monocytes Relative: 11 %
Neutro Abs: 3.8 10*3/uL (ref 1.7–7.7)
Neutrophils Relative %: 72 %
Platelet Count: 232 10*3/uL (ref 150–400)
RBC: 4.21 MIL/uL — ABNORMAL LOW (ref 4.22–5.81)
RDW: 12.5 % (ref 11.5–15.5)
WBC Count: 5.4 10*3/uL (ref 4.0–10.5)
nRBC: 0 % (ref 0.0–0.2)

## 2023-05-06 MED ORDER — IOHEXOL 300 MG/ML  SOLN
75.0000 mL | Freq: Once | INTRAMUSCULAR | Status: AC | PRN
  Administered 2023-05-06: 75 mL via INTRAVENOUS

## 2023-05-12 ENCOUNTER — Inpatient Hospital Stay (HOSPITAL_BASED_OUTPATIENT_CLINIC_OR_DEPARTMENT_OTHER): Admitting: Internal Medicine

## 2023-05-12 VITALS — BP 129/90 | HR 73 | Temp 98.3°F | Resp 17 | Ht 70.0 in | Wt 131.6 lb

## 2023-05-12 DIAGNOSIS — C3490 Malignant neoplasm of unspecified part of unspecified bronchus or lung: Secondary | ICD-10-CM

## 2023-05-12 DIAGNOSIS — C3412 Malignant neoplasm of upper lobe, left bronchus or lung: Secondary | ICD-10-CM | POA: Diagnosis not present

## 2023-05-12 NOTE — Progress Notes (Signed)
 [facility] [phone]   [phone]  OFFICE PROGRESS NOTE  Pcp, No No address on file  DIAGNOSIS: Stage Ib (T2 a, N0, M0 )non-small cell lung cancer, adenocarcinoma.  The patient presented with a left upper lobe pulmonary nodule.  He was diagnosed in August 2022.    PRIOR THERAPY: 1) robotic assisted thorascopy left upper lobectomy under the care of Dr. Dorris Fetch on 12/08/2020    CURRENT THERAPY: Observation   INTERVAL HISTORY: Alex Sims 48 y.o. male returns to the clinic today for follow-up visit.Discussed the use of AI scribe software for clinical note transcription with the patient, who gave verbal consent to proceed.  History of Present Illness   Alex Sims is a 48 year old male with a history of left upper lobe resection for lung cancer who presents with new symptoms of weakness and hot and cold flashes.  He experiences new symptoms of weakness and hot and cold flashes, which began this morning. He typically does not feel unwell.  Three months ago, a CT scan of the chest revealed a concerning spot in the right lower lobe measuring 1.9 by 1.3 cm. A recent scan was performed last week, but the results are pending. Preliminary review of the images suggests that the spot is no longer visible.  He has chest soreness and a painful lump in the left nipple. No use of hormones, pain medications, or smoking marijuana. No erectile dysfunction or use of over-the-counter supplements.  He experiences generalized weakness, particularly in his legs and back, making it difficult to rise from a squatting position. His stomach is also sore.  He has a chronic cough with mucus production. No diarrhea, nausea, or vomiting.       MEDICAL HISTORY: Past Medical History:  Diagnosis Date   Adenocarcinoma of left lung, stage 1 (HCC)     ALLERGIES:  has no known allergies.  MEDICATIONS:  Current Outpatient Medications  Medication Sig  Dispense Refill   albuterol (VENTOLIN HFA) 108 (90 Base) MCG/ACT inhaler Inhale 2 puffs into the lungs every 6 (six) hours as needed for wheezing or shortness of breath.     naproxen (NAPROSYN) 500 MG tablet Take by mouth.     ondansetron (ZOFRAN) 4 MG tablet Take 1 tablet (4 mg total) by mouth every 8 (eight) hours as needed for nausea or vomiting. 15 tablet 0   pregabalin (LYRICA) 25 MG capsule Take 1 capsule (25 mg total) by mouth 2 (two) times daily. 60 capsule 1   No current facility-administered medications for this visit.    SURGICAL HISTORY:  Past Surgical History:  Procedure Laterality Date   APPENDECTOMY     BRONCHIAL BIOPSY  10/02/2020   Procedure: BRONCHIAL BIOPSIES;  Surgeon: Leslye Peer, MD;  Location: [facility] ENDOSCOPY;  Service: Pulmonary;;   BRONCHIAL BRUSHINGS  10/02/2020   Procedure: BRONCHIAL BRUSHINGS;  Surgeon: Leslye Peer, MD;  Location: [facility] ENDOSCOPY;  Service: Pulmonary;;   BRONCHIAL NEEDLE ASPIRATION BIOPSY  10/02/2020   Procedure: BRONCHIAL NEEDLE ASPIRATION BIOPSIES;  Surgeon: Leslye Peer, MD;  Location: [facility] ENDOSCOPY;  Service: Pulmonary;;   BRONCHIAL WASHINGS  10/02/2020   Procedure: BRONCHIAL WASHINGS;  Surgeon: Leslye Peer, MD;  Location: [facility] ENDOSCOPY;  Service: Pulmonary;;   INTERCOSTAL NERVE BLOCK Left 12/08/2020   Procedure: INTERCOSTAL NERVE BLOCK;  Surgeon: Loreli Slot, MD;  Location: [facility] OR;  Service: Thoracic;  Laterality: Left;   NODE DISSECTION Left 12/08/2020   Procedure: NODE DISSECTION;  Surgeon: Loreli Slot, MD;  Location: [facility] OR;  Service: Thoracic;  Laterality: Left;   VIDEO BRONCHOSCOPY WITH ENDOBRONCHIAL NAVIGATION N/A 10/02/2020   Procedure: ROBOTIC BRONCHOSCOPY WITH ENDOBRONCHIAL NAVIGATION;  Surgeon: Leslye Peer, MD;  Location: [facility] ENDOSCOPY;  Service: Pulmonary;  Laterality: N/A;   VIDEO BRONCHOSCOPY WITH RADIAL ENDOBRONCHIAL ULTRASOUND  10/02/2020   Procedure: RADIAL ENDOBRONCHIAL ULTRASOUND;  Surgeon: Leslye Peer, MD;  Location: [facility] ENDOSCOPY;  Service: Pulmonary;;    REVIEW OF SYSTEMS:  Constitutional: positive for fatigue Eyes: negative Ears, nose, mouth, throat, and face: negative Respiratory: negative Cardiovascular: negative Gastrointestinal: negative Genitourinary:negative Integument/breast: positive for breast lump Hematologic/lymphatic: negative Musculoskeletal:negative Neurological: negative Behavioral/Psych: negative Endocrine: negative Allergic/Immunologic: negative   PHYSICAL EXAMINATION: General appearance: alert, cooperative, and fatigued Head: Normocephalic, without obvious abnormality, atraumatic Neck: no adenopathy, no JVD, supple, symmetrical, trachea midline, and thyroid not enlarged, symmetric, no tenderness/mass/nodules Lymph nodes: Cervical, supraclavicular, and axillary nodes normal. Resp: clear to auscultation bilaterally Back: symmetric, no curvature. ROM normal. No CVA tenderness. Cardio: regular rate and rhythm, S1, S2 normal, no murmur, click, rub or gallop GI: soft, non-tender; bowel sounds normal; no masses,  no organomegaly Extremities: extremities normal, atraumatic, no cyanosis or edema Neurologic: Alert and oriented X 3, normal strength and tone. Normal symmetric reflexes. Normal coordination and gait  ECOG PERFORMANCE STATUS: 0 - Asymptomatic  Blood pressure (!) 129/90, pulse 73, temperature 98.3 F (36.8 C), temperature source Temporal, resp. rate 17, height 5\' 10"  (1.778 m), weight 131 lb 9.6 oz (59.7 kg), SpO2 97%.  LABORATORY DATA: Lab Results  Component Value Date   WBC 5.4 05/06/2023   HGB 14.3 05/06/2023   HCT 40.9 05/06/2023   MCV 97.1 05/06/2023   PLT 232 05/06/2023      Chemistry      Component Value Date/Time   NA 139 05/06/2023 1607   K 3.5 05/06/2023 1607   CL 101 05/06/2023 1607   CO2 33 (H) 05/06/2023 1607   BUN 12 05/06/2023 1607   CREATININE 0.72 05/06/2023 1607      Component Value Date/Time   CALCIUM 8.6 (L)  05/06/2023 1607   ALKPHOS 87 05/06/2023 1607   AST 68 (H) 05/06/2023 1607   ALT 12 05/06/2023 1607   BILITOT 0.8 05/06/2023 1607       RADIOGRAPHIC STUDIES: DG Chest 2 View Result Date: 05/01/2023 CLINICAL DATA:  Right-sided chest pain. EXAM: CHEST - 2 VIEW COMPARISON:  February 26, 2018 FINDINGS: The heart size and mediastinal contours are within normal limits. Both lungs are clear. No acute osseous abnormalities are identified. IMPRESSION: No active cardiopulmonary disease. Electronically Signed   By: Aram Candela M.D.   On: 05/01/2023 00:08    ASSESSMENT AND PLAN: This is a very pleasant 49 years old white male with history of stage Ib non-small cell lung cancer, adenocarcinoma status post left upper lobectomy under the care of Dr. Dorris Fetch in October 2022 and the patient has been in observation since that time.  The patient is currently on observation He had repeat CT scan of the chest on December 20, 2022 that showed new 1.9 x 1.3 cm irregular nodular opacity in the right lower lobe associated with areas of bronchovascular ground glass nodularity suspicious for infectious/inflammatory but follow-up scan in 3 months was recommended.  The patient had repeat CT scan of the chest performed few days ago and the final report is still pending.  I personally and independently reviewed the scan images and I see resolution of the  right lower lobe opacity that was seen on the previous scan in October 2024 but I will wait for the final report for confirmation. Assessment and Plan    Lung cancer follow-up Post left upper lobe resection for lung cancer in October 2022. Recent CT scan indicates a previously concerning spot in the right lower lobe is no longer visible, suggesting inflammation rather than malignancy. Awaiting formal radiology report for confirmation. - Await formal radiology report of recent chest CT scan - If the report shows no concerning issues, return to annual follow-up  schedule - If the report shows concerning findings, contact him for further evaluation  Left breast lump Reports a lump in the left breast with associated soreness. Differential diagnosis includes inflammation or hormonal imbalance. No history of hormone use or supplements contributing to gynecomastia. Imaging shows increased density in the left breast compared to the right, but no definitive mass palpated. - Consult with family doctor for further evaluation - Consider ultrasound of the left breast if symptoms persist or worsen - Evaluate for possible testosterone deficiency with family doctor  Generalized weakness and soreness Reports generalized weakness and soreness in legs, back, and stomach. Symptoms are non-specific and could be related to recent illness or underlying conditions. - Monitor symptoms and report any worsening to healthcare provider  Hot flashes Experiencing hot and cold flashes. Differential includes hormonal imbalance such as testosterone deficiency. No current use of hormones or supplements that could explain symptoms. - Discuss potential hormonal evaluation with family doctor   The patient was advised to call immediately if he has any other concerning symptoms in the interval. The patient voices understanding of current disease status and treatment options and is in agreement with the current care plan.  All questions were answered. The patient knows to call the clinic with any problems, questions or concerns. We can certainly see the patient much sooner if necessary.   The total time spent in the appointment was 30 minutes.  Disclaimer: This note was dictated with voice recognition software. Similar sounding words can inadvertently be transcribed and may not be corrected upon review.

## 2023-06-29 ENCOUNTER — Emergency Department (HOSPITAL_COMMUNITY)
Admission: EM | Admit: 2023-06-29 | Discharge: 2023-06-29 | Disposition: A | Discharge status: Home / Self Care | Attending: Emergency Medicine | Admitting: Emergency Medicine

## 2023-06-29 ENCOUNTER — Other Ambulatory Visit: Payer: Self-pay

## 2023-06-29 ENCOUNTER — Encounter (HOSPITAL_COMMUNITY): Payer: Self-pay

## 2023-06-29 ENCOUNTER — Emergency Department (HOSPITAL_COMMUNITY)

## 2023-06-29 DIAGNOSIS — Z85118 Personal history of other malignant neoplasm of bronchus and lung: Secondary | ICD-10-CM | POA: Insufficient documentation

## 2023-06-29 DIAGNOSIS — M79602 Pain in left arm: Secondary | ICD-10-CM | POA: Diagnosis present

## 2023-06-29 MED ORDER — NAPROXEN 250 MG PO TABS
500.0000 mg | ORAL_TABLET | Freq: Once | ORAL | Status: AC
  Administered 2023-06-29: 500 mg via ORAL
  Filled 2023-06-29: qty 2

## 2023-06-29 MED ORDER — NAPROXEN 500 MG PO TABS: 500.0000 mg | ORAL_TABLET | Freq: Two times a day (BID) | ORAL | 0 refills | Status: AC

## 2023-06-29 MED ORDER — LIDOCAINE 5 % EX PTCH
1.0000 | MEDICATED_PATCH | CUTANEOUS | Status: DC
Start: 2023-06-29 — End: 2023-06-29
  Administered 2023-06-29: 1 via TRANSDERMAL
  Filled 2023-06-29: qty 1

## 2023-06-29 MED ORDER — 1ST MEDX-PATCH/ LIDOCAINE 4-0.025-5-20 % EX PTCH
1.0000 | MEDICATED_PATCH | Freq: Every day | CUTANEOUS | 0 refills | Status: AC | PRN
Start: 2023-06-29 — End: 2023-07-09

## 2023-06-29 NOTE — ED Notes (Signed)
 ED Provider at bedside.

## 2023-06-29 NOTE — ED Notes (Signed)
ETOH on board 

## 2023-06-29 NOTE — ED Triage Notes (Signed)
 Pt has had ongoing left arm pain for a month, has been seen by urgent care and diagnosed with tennis elbow. States he injured it at work. Good range of motion.

## 2023-06-29 NOTE — ED Notes (Signed)
 Patient transported to X-ray

## 2023-06-29 NOTE — ED Provider Notes (Signed)
 [place] EMERGENCY DEPARTMENT AT [facility] Provider Note   CSN: [id number] Arrival date & time: 06/29/23  0112     History  Chief Complaint  Patient presents with   Arm Pain    Alex Sims is a 48 y.o. male.  48 year old male who presents ER today with persistent arm pain.  Patient states for about 4 months he was having some pain just proximal to his left elbow.  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  He was seen in urgent care a few weeks ago told he had tennis elbow however patient states symptoms have any better she presents here for further evaluation.  No other new injuries.  No fevers.  No direct trauma.  Has not taken thing at home for her symptoms.  Patient also states he has a history of lung cancer and has pain in his lungs similar to when he had lung cancer in the past.  No exertional component.  No cough, shortness of breath or other associated symptoms.   Arm Pain       Home Medications Prior to Admission medications   Medication Sig Start Date End Date Taking? Authorizing Provider  Lido-Capsaicin-Men-Methyl Sal (1ST MEDX-PATCH/ LIDOCAINE ) 4-0.025-5-20 % PTCH Apply 1 patch topically daily as needed for up to 10 days (pain). 06/29/23 07/09/23 Yes Tykeisha Peer, Reymundo Caulk, MD  naproxen (NAPROSYN) 500 MG tablet Take 1 tablet (500 mg total) by mouth 2 (two) times daily. 06/29/23  Yes Imani Sherrin, Reymundo Caulk, MD  albuterol  (VENTOLIN  HFA) 108 (90 Base) MCG/ACT inhaler Inhale 2 puffs into the lungs every 6 (six) hours as needed for wheezing or shortness of breath. 08/14/20   [provider]      Allergies    Patient has no known allergies.    Review of Systems   Review of Systems  Physical Exam Updated Vital Signs BP (!) 126/98 (BP Location: Right Arm)   Pulse 70   Temp 98 F (36.7 C) (Oral)   Resp 17   Ht 5\' 10"  (1.778 m)   Wt 59.4 kg   SpO2 97%   BMI  18.80 kg/m  Physical Exam Vitals and nursing note reviewed.  Constitutional:      Appearance: He is well-developed.  HENT:     Head: Normocephalic and atraumatic.  Cardiovascular:     Rate and Rhythm: Normal rate.  Pulmonary:     Effort: Pulmonary effort is normal. No respiratory distress.  Abdominal:     General: There is no distension.  Musculoskeletal:        General: Tenderness (Over left brachial radialis.) present. No swelling. Normal range of motion.     Cervical back: Normal range of motion.  Skin:    General: Skin is warm and dry.  Neurological:     Mental Status: He is alert.     ED Results / Procedures / Treatments   Labs (all labs ordered are listed, but only abnormal results are displayed) Labs Reviewed - No data to display  EKG None  Radiology DG Chest 2 View Result Date: 06/29/2023 CLINICAL DATA:  Left arm pain for several months, initial encounter EXAM: CHEST - 2 VIEW COMPARISON:  04/30/2023 FINDINGS: The heart size and mediastinal contours are within normal limits. Both lungs are clear. The visualized skeletal structures are unremarkable. IMPRESSION: No active cardiopulmonary disease. Electronically Signed   By: Regenia Cape.D.  On: 06/29/2023 04:09    Procedures Procedures    Medications Ordered in ED Medications  lidocaine  (LIDODERM ) 5 % 1 patch (1 patch Transdermal Patch Applied 06/29/23 0358)  naproxen (NAPROSYN) tablet 500 mg (500 mg Oral Given 06/29/23 0357)    ED Course/ Medical Decision Making/ A&P                                 Medical Decision Making Amount and/or Complexity of Data Reviewed Radiology: ordered. ECG/medicine tests: ordered.  Risk OTC drugs. Prescription drug management.   EKG and chest x-ray done secondary to his random episodes of atypical chest pain.  Reassuring.  No evidence of recurrent cancer, pneumonia, pneumothorax or other causes.  Had a CT scan done a couple months ago that was also normal.  No further  workup required.   Not sure on the cause of his elbow pain.  It does not like it came from an overuse injury initially.  Will employ symptomatic treatments if not get better next few days call orthopedics for follow-up.  Final Clinical Impression(s) / ED Diagnoses Final diagnoses:  Left arm pain    Rx / DC Orders ED Discharge Orders          Ordered    Lido-Capsaicin-Men-Methyl Sal (1ST MEDX-PATCH/ LIDOCAINE ) 4-0.025-5-20 % PTCH  Daily PRN        06/29/23 0433    naproxen (NAPROSYN) 500 MG tablet  2 times daily        06/29/23 0433              Briaunna Grindstaff, Reymundo Caulk, MD 06/29/23 1610

## 2023-10-01 ENCOUNTER — Telehealth: Payer: Self-pay | Admitting: Medical Oncology

## 2023-10-01 NOTE — Telephone Encounter (Signed)
 Patient reports coughing up blood, occasional clots, for the past 3 days. Estimates the largest amount expectorated at one time to be approximately 1-2 ounces. He also endorses feeling "winded," though he states the symptoms are not interfering with his ability to work.Denies fever.  He does not have a PCP.  Next scan and f/u March 2026.

## 2023-10-02 ENCOUNTER — Telehealth: Payer: Self-pay | Admitting: *Deleted

## 2023-10-02 NOTE — Telephone Encounter (Signed)
 LM to call Dr Bing Buff office

## 2023-10-02 NOTE — Telephone Encounter (Signed)
 Alex Sims says he has to go to work. Will call back if he wants to be seen.

## 2023-10-07 ENCOUNTER — Telehealth: Payer: Self-pay

## 2023-10-07 NOTE — Telephone Encounter (Signed)
 Patient called and LVM stating to give him a call.    Returned patients call and no answer.  LVM for a return call if needed.

## 2023-10-09 ENCOUNTER — Telehealth: Payer: Self-pay | Admitting: Medical Oncology

## 2023-10-09 ENCOUNTER — Encounter: Payer: Self-pay | Admitting: Medical Oncology

## 2023-10-09 NOTE — Telephone Encounter (Signed)
 Letter emailed to pt re dx.

## 2024-05-03 ENCOUNTER — Inpatient Hospital Stay

## 2024-05-17 ENCOUNTER — Other Ambulatory Visit

## 2024-05-17 ENCOUNTER — Inpatient Hospital Stay: Admitting: Internal Medicine
